# Patient Record
Sex: Female | Born: 1975 | Race: Black or African American | Hispanic: No | Marital: Single | Smoking: Current every day smoker
Health system: Southern US, Community
[De-identification: ages and names within clinical notes are randomized; demographics above are authoritative.]

## PROBLEM LIST (undated history)

## (undated) DIAGNOSIS — A159 Respiratory tuberculosis unspecified: Secondary | ICD-10-CM

## (undated) DIAGNOSIS — D219 Benign neoplasm of connective and other soft tissue, unspecified: Secondary | ICD-10-CM

## (undated) HISTORY — PX: NO PAST SURGERIES: SHX2092

## (undated) HISTORY — DX: Respiratory tuberculosis unspecified: A15.9

## (undated) HISTORY — DX: Benign neoplasm of connective and other soft tissue, unspecified: D21.9

---

## 2017-06-23 ENCOUNTER — Other Ambulatory Visit: Payer: Self-pay

## 2017-06-23 ENCOUNTER — Encounter (HOSPITAL_COMMUNITY): Payer: Self-pay | Admitting: Emergency Medicine

## 2017-06-23 DIAGNOSIS — H00034 Abscess of left upper eyelid: Secondary | ICD-10-CM | POA: Insufficient documentation

## 2017-06-23 DIAGNOSIS — F17200 Nicotine dependence, unspecified, uncomplicated: Secondary | ICD-10-CM | POA: Insufficient documentation

## 2017-06-23 NOTE — ED Triage Notes (Signed)
Patient has an abscess on left eyelid. Patient states it started a little over a week ago as a pimple and has continue to grow.

## 2017-06-24 ENCOUNTER — Encounter (HOSPITAL_COMMUNITY): Payer: Self-pay

## 2017-06-24 ENCOUNTER — Emergency Department (HOSPITAL_COMMUNITY)
Admission: EM | Admit: 2017-06-24 | Discharge: 2017-06-24 | Disposition: A | Discharge status: Home / Self Care | Payer: Self-pay | Attending: Emergency Medicine | Admitting: Emergency Medicine

## 2017-06-24 ENCOUNTER — Emergency Department (HOSPITAL_COMMUNITY): Payer: Self-pay

## 2017-06-24 DIAGNOSIS — L0291 Cutaneous abscess, unspecified: Secondary | ICD-10-CM

## 2017-06-24 LAB — I-STAT BETA HCG BLOOD, ED (MC, WL, AP ONLY)

## 2017-06-24 LAB — CBC
HEMATOCRIT: 35.8 % — AB (ref 36.0–46.0)
Hemoglobin: 11.6 g/dL — ABNORMAL LOW (ref 12.0–15.0)
MCH: 30.5 pg (ref 26.0–34.0)
MCHC: 32.4 g/dL (ref 30.0–36.0)
MCV: 94.2 fL (ref 78.0–100.0)
PLATELETS: 378 10*3/uL (ref 150–400)
RBC: 3.8 MIL/uL — AB (ref 3.87–5.11)
RDW: 14.7 % (ref 11.5–15.5)
WBC: 7.5 10*3/uL (ref 4.0–10.5)

## 2017-06-24 LAB — I-STAT CHEM 8, ED
BUN: 17 mg/dL (ref 6–20)
CHLORIDE: 108 mmol/L (ref 101–111)
Calcium, Ion: 1.01 mmol/L — ABNORMAL LOW (ref 1.15–1.40)
Creatinine, Ser: 0.7 mg/dL (ref 0.44–1.00)
Glucose, Bld: 92 mg/dL (ref 65–99)
HEMATOCRIT: 37 % (ref 36.0–46.0)
HEMOGLOBIN: 12.6 g/dL (ref 12.0–15.0)
POTASSIUM: 4.2 mmol/L (ref 3.5–5.1)
Sodium: 136 mmol/L (ref 135–145)
TCO2: 21 mmol/L — ABNORMAL LOW (ref 22–32)

## 2017-06-24 MED ORDER — IOHEXOL 300 MG/ML  SOLN
75.0000 mL | Freq: Once | INTRAMUSCULAR | Status: AC | PRN
  Administered 2017-06-24: 75 mL via INTRAVENOUS

## 2017-06-24 MED ORDER — LIDOCAINE-EPINEPHRINE (PF) 2 %-1:200000 IJ SOLN
10.0000 mL | Freq: Once | INTRAMUSCULAR | Status: AC
  Administered 2017-06-24: 10 mL via INTRADERMAL
  Filled 2017-06-24: qty 20

## 2017-06-24 MED ORDER — CLINDAMYCIN HCL 300 MG PO CAPS: 300.0000 mg | ORAL_CAPSULE | Freq: Three times a day (TID) | ORAL | 0 refills | Status: AC

## 2017-06-24 MED ORDER — OXYCODONE-ACETAMINOPHEN 5-325 MG PO TABS
1.0000 | ORAL_TABLET | Freq: Once | ORAL | Status: AC
  Administered 2017-06-24: 1 via ORAL
  Filled 2017-06-24: qty 1

## 2017-06-24 NOTE — Discharge Instructions (Addendum)
Please take antibiotic 3 times a day for the next week.  It is normal to have some drainage from the incision for the next few days.  Please wash with warm soapy water daily.  Your blood pressure was elevated in the ER today, please have this rechecked.  Return to the emergency department if you have any new or concerning symptoms like fever greater than 100.4 F, worsening redness or swelling around the eye, difficulty with your vision.

## 2017-06-24 NOTE — ED Provider Notes (Signed)
[facility] DEPT Provider Note   CSN: [id number] Arrival date & time: 06/23/17  2137     History   Chief Complaint Chief Complaint  Patient presents with  . Abscess    HPI Kenedie Dirocco is a 42 y.o. female.  HPI   Ms. Yamamoto is a 42yo female with a history of recurrent boils who presents to the emergency department for evaluation of left eyelid abscess.  Patient reports that about a week ago she noticed what looked like a pimple on the upper left lid.  States that throughout the week it has grown in size, become red and tender to the touch.  She noticed some mild purulent drainage few days ago.  She has tried using home remedies including bacon grease over the eyelid without improvement.  She reports her pain is 8/10 in severity at this time it feels throbbing.  Pain is worsened with palpation of the eyelid.  It is somewhat painful with looking straight upward.  She denies fevers, chills, visual disturbance, headache, abdominal pain, nausea/vomiting.  She denies history of IV drug use.  History reviewed. No pertinent past medical history.  There are no active problems to display for this patient.   History reviewed. No pertinent surgical history.   OB History   None      Home Medications    Prior to Admission medications   Not on File    Family History History reviewed. No pertinent family history.  Social History Social History   Tobacco Use  . Smoking status: Current Every Day Smoker  . Smokeless tobacco: Never Used  Substance Use Topics  . Alcohol use: Never    Frequency: Never  . Drug use: Never     Allergies   Patient has no known allergies.   Review of Systems Review of Systems  Constitutional: Negative for chills and fever.  Eyes: Negative for photophobia, pain, redness and visual disturbance.  Gastrointestinal: Negative for abdominal pain, nausea and vomiting.  Skin: Positive for color change (red bump  on left upper lid).  Neurological: Negative for headaches.     Physical Exam Updated Vital Signs BP (!) 154/88 (BP Location: Left Arm)   Pulse 80   Temp 97.8 F (36.6 C) (Oral)   Resp 14   Ht 5' 2.5" (1.588 m)   Wt 64.9 kg (143 lb)   LMP 06/23/2017 Comment: negative beta HCG 06/24/17  SpO2 100%   BMI 25.74 kg/m   Physical Exam  Constitutional: She appears well-developed and well-nourished. No distress.  HENT:  Head: Normocephalic and atraumatic.  Mouth/Throat: Oropharynx is clear and moist. No oropharyngeal exudate.  Eyes: Right eye exhibits no discharge. Left eye exhibits no discharge.  1cm area of fluctuance below the left eyebrow with erythema and tenderness. No active drainage.  No proptosis.  Full EOM, although painful with moving up and to the left. PERRL. Peripheral visual fields intact to confrontation. No scleral erythema.   Neck: Normal range of motion. Neck supple.  Pulmonary/Chest: Effort normal. No respiratory distress.  Neurological: She is alert. Coordination normal.  Skin: She is not diaphoretic.  Psychiatric: She has a normal mood and affect. Her behavior is normal.  Nursing note and vitals reviewed.      ED Treatments / Results  Labs (all labs ordered are listed, but only abnormal results are displayed) Labs Reviewed  CBC - Abnormal; Notable for the following components:      Result Value   RBC 3.80 (*)  Hemoglobin 11.6 (*)    HCT 35.8 (*)    All other components within normal limits  I-STAT CHEM 8, ED - Abnormal; Notable for the following components:   Calcium, Ion 1.01 (*)    TCO2 21 (*)    All other components within normal limits  I-STAT BETA HCG BLOOD, ED ([facility], [facility], [facility] ONLY)    EKG None  Radiology Ct Orbits W Contrast  Result Date: 06/24/2017 CLINICAL DATA:  42 y/o  F; large knot on her left eyelid. EXAM: CT ORBITS WITH CONTRAST TECHNIQUE: Multidetector CT images was performed according to the standard protocol following intravenous  contrast administration. CONTRAST:  66mL OMNIPAQUE IOHEXOL 300 MG/ML  SOLN COMPARISON:  None. FINDINGS: Orbits: No traumatic or inflammatory finding. Globes, optic nerves, orbital fat, extraocular muscles, vascular structures, and lacrimal glands are normal. Visualized sinuses: Mild mucosal thickening with the left sphenoid sinus and the anterior ethmoid sinuses. Soft tissues: Left superolateral periorbital dermal rim enhancing collection measuring 10 x 7 x 10 mm (AP x ML x CC series 4, image 10 and series 8, image 57). There is surrounding inflammation in the periorbital soft tissues. Limited intracranial: No significant or unexpected finding. IMPRESSION: 10 mm abscess of the left superolateral periorbital dermis. No orbital compartment inflammation. Electronically Signed   By: Kristine Garbe M.D.   On: 06/24/2017 06:46    Procedures .Marland KitchenIncision and Drainage Date/Time: 06/24/2017 7:29 AM Performed by: Glyn Ade, PA-C Authorized by: Glyn Ade, PA-C   Consent:    Consent obtained:  Verbal and emergent situation   Consent given by:  Patient   Risks discussed:  Bleeding, incomplete drainage, infection and pain   Alternatives discussed:  No treatment and delayed treatment Location:    Type:  Abscess   Size:  1cm   Location:  Head   Head location:  Face Pre-procedure details:    Skin preparation:  Betadine Anesthesia (see MAR for exact dosages):    Anesthesia method:  Local infiltration   Local anesthetic:  Lidocaine 2% WITH epi Procedure type:    Complexity:  Simple Procedure details:    Incision types:  Single straight   Scalpel blade:  11   Wound management:  Probed and deloculated   Drainage:  Purulent   Drainage amount:  Moderate   Wound treatment:  Wound left open   Packing materials:  None Post-procedure details:    Patient tolerance of procedure:  Tolerated well, no immediate complications   (including critical care time)  Medications Ordered in  ED Medications  lidocaine-EPINEPHrine (XYLOCAINE W/EPI) 2 %-1:200000 (PF) injection 10 mL (10 mLs Intradermal Given by Other 06/24/17 0510)  oxyCODONE-acetaminophen (PERCOCET/ROXICET) 5-325 MG per tablet 1 tablet (1 tablet Oral Given 06/24/17 0510)  iohexol (OMNIPAQUE) 300 MG/ML solution 75 mL (75 mLs Intravenous Contrast Given 06/24/17 0624)     Initial Impression / Assessment and Plan / ED Course  I have reviewed the triage vital signs and the nursing notes.  Pertinent labs & imaging results that were available during my care of the patient were reviewed by me and considered in my medical decision making (see chart for details).     Patient presents with abscess below the left eyebrow.  She denies fever, but does report pain with looking up into the left.  No proptosis on exam.  Given painful EOM, concern for orbital cellulitis.  CT orbit reveals a 1 cm abscess over the superior lateral periorbital dermis, no orbital cellulitis.  Otherwise lab work reassuring,  no leukocytosis.  No major electrolyte abnormalities.  I&D performed in the emergency department.  Patient tolerated procedure well.  Will discharge with clindamycin and have counseled her on return precautions.  Her blood pressure was elevated in the ER today, counseled her to have this rechecked by her PCP.  Patient agrees and voiced understanding to the above plan and appears reliable for follow-up.  Discussed this patient with Dr. Kathrynn Humble who agrees with above plan and discharge home.  Final Clinical Impressions(s) / ED Diagnoses   Final diagnoses:  Abscess    ED Discharge Orders        Ordered    clindamycin (CLEOCIN) 300 MG capsule  3 times daily     06/24/17 0725       Glyn Ade, PA-C 06/24/17 Houck, Ankit, MD 06/24/17 [phone]

## 2017-06-24 NOTE — ED Notes (Signed)
Bed: WA06 Expected date:  Expected time:  Means of arrival:  Comments: 

## 2017-06-24 NOTE — ED Notes (Signed)
I and D tray with lidocaine is at bedside.

## 2017-06-24 NOTE — ED Notes (Signed)
Bed: [facility]ALA Expected date:  Expected time:  Means of arrival:  Comments: 

## 2017-09-14 ENCOUNTER — Other Ambulatory Visit: Payer: Self-pay

## 2017-09-14 ENCOUNTER — Emergency Department (HOSPITAL_COMMUNITY)
Admission: EM | Admit: 2017-09-14 | Discharge: 2017-09-14 | Disposition: A | Discharge status: Home / Self Care | Payer: Self-pay | Attending: Emergency Medicine | Admitting: Emergency Medicine

## 2017-09-14 ENCOUNTER — Emergency Department (HOSPITAL_COMMUNITY): Payer: Self-pay

## 2017-09-14 ENCOUNTER — Encounter (HOSPITAL_COMMUNITY): Payer: Self-pay | Admitting: *Deleted

## 2017-09-14 DIAGNOSIS — R531 Weakness: Secondary | ICD-10-CM | POA: Insufficient documentation

## 2017-09-14 DIAGNOSIS — M542 Cervicalgia: Secondary | ICD-10-CM | POA: Insufficient documentation

## 2017-09-14 DIAGNOSIS — H00034 Abscess of left upper eyelid: Secondary | ICD-10-CM | POA: Insufficient documentation

## 2017-09-14 DIAGNOSIS — R202 Paresthesia of skin: Secondary | ICD-10-CM | POA: Insufficient documentation

## 2017-09-14 DIAGNOSIS — L0201 Cutaneous abscess of face: Secondary | ICD-10-CM

## 2017-09-14 DIAGNOSIS — F17200 Nicotine dependence, unspecified, uncomplicated: Secondary | ICD-10-CM | POA: Insufficient documentation

## 2017-09-14 LAB — BASIC METABOLIC PANEL
ANION GAP: 6 (ref 5–15)
BUN: 12 mg/dL (ref 6–20)
CO2: 25 mmol/L (ref 22–32)
Calcium: 8.7 mg/dL — ABNORMAL LOW (ref 8.9–10.3)
Chloride: 104 mmol/L (ref 98–111)
Creatinine, Ser: 0.77 mg/dL (ref 0.44–1.00)
GFR calc Af Amer: >60 mL/min (ref >60)
GFR calc non Af Amer: >60 mL/min (ref >60)
GLUCOSE: 80 mg/dL (ref 70–99)
Potassium: 3.9 mmol/L (ref 3.5–5.1)
Sodium: 135 mmol/L (ref 135–145)

## 2017-09-14 LAB — CBC WITH DIFFERENTIAL/PLATELET
Basophils Absolute: 0.1 10*3/uL (ref 0.0–0.1)
Basophils Relative: 1 %
Eosinophils Absolute: 0.4 10*3/uL (ref 0.0–0.7)
Eosinophils Relative: 6 %
HEMATOCRIT: 35.9 % — AB (ref 36.0–46.0)
Hemoglobin: 11.9 g/dL — ABNORMAL LOW (ref 12.0–15.0)
LYMPHS ABS: 2.7 10*3/uL (ref 0.7–4.0)
LYMPHS PCT: 43 %
MCH: 31.3 pg (ref 26.0–34.0)
MCHC: 33.1 g/dL (ref 30.0–36.0)
MCV: 94.5 fL (ref 78.0–100.0)
MONOS PCT: 9 %
Monocytes Absolute: 0.5 10*3/uL (ref 0.1–1.0)
NEUTROS ABS: 2.6 10*3/uL (ref 1.7–7.7)
Neutrophils Relative %: 41 %
Platelets: 369 10*3/uL (ref 150–400)
RBC: 3.8 MIL/uL — ABNORMAL LOW (ref 3.87–5.11)
RDW: 15.8 % — ABNORMAL HIGH (ref 11.5–15.5)
WBC: 6.2 10*3/uL (ref 4.0–10.5)

## 2017-09-14 LAB — POC URINE PREG, ED: Preg Test, Ur: NEGATIVE

## 2017-09-14 MED ORDER — DIAZEPAM 5 MG PO TABS
5.0000 mg | ORAL_TABLET | Freq: Once | ORAL | Status: AC
  Administered 2017-09-14: 5 mg via ORAL
  Filled 2017-09-14: qty 1

## 2017-09-14 MED ORDER — HYDROCODONE-ACETAMINOPHEN 5-325 MG PO TABS
1.0000 | ORAL_TABLET | Freq: Once | ORAL | Status: AC
  Administered 2017-09-14: 1 via ORAL
  Filled 2017-09-14: qty 1

## 2017-09-14 MED ORDER — METHOCARBAMOL 500 MG PO TABS: 500.0000 mg | ORAL_TABLET | Freq: Two times a day (BID) | ORAL | 0 refills | Status: AC

## 2017-09-14 MED ORDER — DOXYCYCLINE HYCLATE 100 MG PO CAPS: 100.0000 mg | ORAL_CAPSULE | Freq: Two times a day (BID) | ORAL | 0 refills | Status: AC

## 2017-09-14 MED ORDER — LIDOCAINE 5 % EX PTCH
1.0000 | MEDICATED_PATCH | CUTANEOUS | Status: DC
  Administered 2017-09-14: 1 via TRANSDERMAL
  Filled 2017-09-14: qty 1

## 2017-09-14 MED ORDER — DOXYCYCLINE HYCLATE 100 MG PO TABS
100.0000 mg | ORAL_TABLET | Freq: Once | ORAL | Status: AC
Start: 2017-09-14 — End: 2017-09-14
  Administered 2017-09-14: 100 mg via ORAL
  Filled 2017-09-14: qty 1

## 2017-09-14 MED ORDER — GADOBENATE DIMEGLUMINE 529 MG/ML IV SOLN
15.0000 mL | Freq: Once | INTRAVENOUS | Status: AC | PRN
  Administered 2017-09-14: 14 mL via INTRAVENOUS

## 2017-09-14 MED ORDER — HYDROCODONE-ACETAMINOPHEN 5-325 MG PO TABS: 1.0000 | ORAL_TABLET | Freq: Four times a day (QID) | ORAL | 0 refills | Status: DC | PRN

## 2017-09-14 MED ORDER — LIDOCAINE HCL (PF) 1 % IJ SOLN
10.0000 mL | Freq: Once | INTRAMUSCULAR | Status: AC
  Administered 2017-09-14: 10 mL
  Filled 2017-09-14: qty 30

## 2017-09-14 NOTE — ED Provider Notes (Signed)
[facility] DEPT Provider Note   CSN: [id number] Arrival date & time: 09/14/17  1400     History   Chief Complaint Chief Complaint  Patient presents with  . Abscess    HPI Lori Hughes is a 41 y.o. female.  HPI   Patient is a 73 old female with no significant past medical history presenting for abscess of the left upper eyelid.  Patient reports that she has had this before, and it was drained approximately 1 month ago, however it is returned over the past week.  Patient denies any drainage from it, obstructive vision, swelling or redness around the eye, retro-orbital pain, or fever chills.  No history of IVDU.  Patient reports he does have history of recurrent abscesses.  Patient is also noting that she has had some left-sided neck pain progressing into arm weakness over the past 6 days.  Patient reports that happened to her one month ago, however it resolved spontaneously.  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  Patient denies any numbness of any of her extremities, nor bilateral lower extreme any weakness.  Patient denies any midline neck pain, recent neck trauma, neck surgeries, or history of autoimmune disease.  Patient denies any saddle anesthesia, loss of our bladder control, visual disturbance, or vertigo.  History reviewed. No pertinent past medical history.  There are no active problems to display for this patient.   History reviewed. No pertinent surgical history.   OB History   None      Home Medications    Prior to Admission medications   Medication Sig Start Date End Date Taking? Authorizing Provider  ibuprofen (ADVIL,MOTRIN) 200 MG tablet Take 1,200 mg by mouth every 6 (six) hours as needed for moderate pain.   Yes [provider]    Family History No family history on file.  Social History Social History   Tobacco Use  . Smoking status: Current Every Day Smoker  . Smokeless tobacco: Never  Used  Substance Use Topics  . Alcohol use: Never    Frequency: Never  . Drug use: Never     Allergies   Patient has no known allergies.   Review of Systems Review of Systems  Constitutional: Negative for chills and fever.  HENT: Negative for congestion, rhinorrhea, sinus pain and sore throat.   Eyes: Negative for visual disturbance.  Respiratory: Negative for cough, chest tightness and shortness of breath.   Cardiovascular: Negative for chest pain, palpitations and leg swelling.  Gastrointestinal: Negative for abdominal pain, nausea and vomiting.  Genitourinary: Negative for dysuria and flank pain.  Musculoskeletal: Negative for back pain and myalgias.  Skin: Positive for color change. Negative for rash.  Neurological: Positive for weakness and numbness. Negative for dizziness and syncope.     Physical Exam Updated Vital Signs BP 131/85 (BP Location: Right Arm)   Pulse 79   Temp 97.7 F (36.5 C) (Oral)   Resp 17   Ht 5\' 2"  (1.575 m)   Wt 65.8 kg (145 lb)   SpO2 100%   BMI 26.52 kg/m   Physical Exam  Constitutional: She appears well-developed and well-nourished. No distress.  HENT:  Head: Normocephalic and atraumatic.  Mouth/Throat: Oropharynx is clear and moist.  Eyes: Pupils are equal, round, and reactive to light. Conjunctivae and EOM are normal.  See clinical photo for details.  Patient has a well-circumscribed, 1.5 cm in diameter fluctuant nodule along the left superior orbital rim.  Neck: Normal range of  motion. Neck supple.  Cardiovascular: Normal rate, regular rhythm, S1 normal and S2 normal.  No murmur heard. Pulmonary/Chest: Effort normal and breath sounds normal. She has no wheezes. She has no rales.  Abdominal: Soft. She exhibits no distension. There is no tenderness. There is no guarding.  Musculoskeletal: Normal range of motion. She exhibits no edema or deformity.  PALPATION: No midline but paraspinal musculature tenderness of cervical and thoracic  spine on left.  ROM of cervical spine intact with flexion/extension/lateral flexion/lateral rotation; Patient can laterally rotate cervical spine greater than 45 degrees.  MOTOR: 4/5 strength on left with resisted shoulder abduction/adduction, biceps flexion (C5/6), biceps extension (C6-C8), wrist flexion, wrist extension (C6-C8), and grip strength (C7-T1). Strength 5/5 in RUE. 2+ DTRs in the biceps and triceps. Symmetric b/l SENSORY: Sensation is intact to light touch in:  Superficial radial nerve distribution (dorsal first web space).Median nerve distribution (tip of index finger)   Ulnar nerve distribution (tip of small finger)   Reported decreased sensation over deltoid/axillary nerve distribution. Patient moves LEs symmetrically and with good coordination. Patient ambulates symmetrically with no evidence of LE weakness.   Lymphadenopathy:    She has no cervical adenopathy.  Neurological: She is alert.  Cranial nerves grossly intact. Patient moves extremities symmetrically and with good coordination.  Skin: Skin is warm and dry. No rash noted. No erythema.  Psychiatric: She has a normal mood and affect. Her behavior is normal. Judgment and thought content normal.  Nursing note and vitals reviewed.      ED Treatments / Results  Labs (all labs ordered are listed, but only abnormal results are displayed) Labs Reviewed  BASIC METABOLIC PANEL - Abnormal; Notable for the following components:      Result Value   Calcium 8.7 (*)    All other components within normal limits  CBC WITH DIFFERENTIAL/PLATELET - Abnormal; Notable for the following components:   RBC 3.80 (*)    Hemoglobin 11.9 (*)    HCT 35.9 (*)    RDW 15.8 (*)    All other components within normal limits  POC URINE PREG, ED    EKG None  Radiology Mr Jeri Cos And Wo Contrast  Result Date: 09/14/2017 CLINICAL DATA:  Left arm pain and weakness beginning 8 days ago. EXAM: MRI HEAD WITHOUT AND WITH CONTRAST MRI  CERVICAL SPINE WITHOUT AND WITH CONTRAST TECHNIQUE: Multiplanar, multiecho pulse sequences of the brain and surrounding structures, and cervical spine, to include the craniocervical junction and cervicothoracic junction, were obtained without and with intravenous contrast. CONTRAST:  32mL MULTIHANCE GADOBENATE DIMEGLUMINE 529 MG/ML IV SOLN COMPARISON:  Head CT 06/24/2017 FINDINGS: MRI HEAD FINDINGS Brain: Brain has normal appearance without evidence of malformation, atrophy, old or acute small or large vessel infarction, mass lesion, hemorrhage, hydrocephalus or extra-axial collection. Cerebellar tonsils extend 2 mm through the foramen magnum, upper limits of normal. No abnormal contrast enhancement occurs. Vascular: Major vessels at the base of the brain show flow. Venous sinuses appear patent. Skull and upper cervical spine: Normal. Sinuses/Orbits: Some mucosal inflammatory changes of the sinuses, particularly in the frontal and ethmoid regions. Orbits appear negative. Other: One can appreciate the superficial soft tissue abscess in the left supraorbital region, with the nonenhancing fluid collection measuring about 11 mm in diameter. No evidence of intracranial extension. No evidence that this relates to the underlying fairly mild inflammatory changes of the paranasal sinuses. MRI CERVICAL SPINE FINDINGS Alignment: Kyphotic curvature in the cervical region. Vertebrae: No fracture or primary bone lesion. Cord:  No cord compression or primary cord lesion. Posterior Fossa, vertebral arteries, paraspinal tissues: Negative. As noted above, cerebellar tonsils extend 2 or 3 mm through the foramen magnum, upper limits of normal. Disc levels: No abnormality at C1-2, C2-3 or C3-4. C4-5: Shallow protrusion of the disc associated with endplate osteophytes. Narrowing of the ventral subarachnoid space but no compression of the cord. Foraminal encroachment bilaterally that could possibly affect either C5 nerve. C5-6: Small  central disc protrusion indents the ventral subarachnoid space but does not compress the cord or show foraminal extension. C6-7 and C7-T1: Normal. IMPRESSION: Brain MRI: Normal appearance of the brain itself. Mild mucosal inflammation of the paranasal sinuses. Left supraorbital superficial soft tissue abscess, 11 mm in diameter. Cervical spine MRI: Kyphotic curvature. 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 Canal narrowing without cord compression. Bilateral foraminal encroachment that could affect either or both C5 nerves. I would suspect these findings are chronic. Electronically Signed   By: Nelson Chimes M.D.   On: 09/14/2017 20:20   Mr Cervical Spine W Wo Contrast  Result Date: 09/14/2017 CLINICAL DATA:  Left arm pain and weakness beginning 8 days ago. EXAM: MRI HEAD WITHOUT AND WITH CONTRAST MRI CERVICAL SPINE WITHOUT AND WITH CONTRAST TECHNIQUE: Multiplanar, multiecho pulse sequences of the brain and surrounding structures, and cervical spine, to include the craniocervical junction and cervicothoracic junction, were obtained without and with intravenous contrast. CONTRAST:  32mL MULTIHANCE GADOBENATE DIMEGLUMINE 529 MG/ML IV SOLN COMPARISON:  Head CT 06/24/2017 FINDINGS: MRI HEAD FINDINGS Brain: Brain has normal appearance without evidence of malformation, atrophy, old or acute small or large vessel infarction, mass lesion, hemorrhage, hydrocephalus or extra-axial collection. Cerebellar tonsils extend 2 mm through the foramen magnum, upper limits of normal. No abnormal contrast enhancement occurs. Vascular: Major vessels at the base of the brain show flow. Venous sinuses appear patent. Skull and upper cervical spine: Normal. Sinuses/Orbits: Some mucosal inflammatory changes of the sinuses, particularly in the frontal and ethmoid regions. Orbits appear negative. Other: One can appreciate the superficial soft tissue abscess in the left supraorbital region, with the nonenhancing fluid collection measuring about 11  mm in diameter. No evidence of intracranial extension. No evidence that this relates to the underlying fairly mild inflammatory changes of the paranasal sinuses. MRI CERVICAL SPINE FINDINGS Alignment: Kyphotic curvature in the cervical region. Vertebrae: No fracture or primary bone lesion. Cord: No cord compression or primary cord lesion. Posterior Fossa, vertebral arteries, paraspinal tissues: Negative. As noted above, cerebellar tonsils extend 2 or 3 mm through the foramen magnum, upper limits of normal. Disc levels: No abnormality at C1-2, C2-3 or C3-4. C4-5: Shallow protrusion of the disc associated with endplate osteophytes. Narrowing of the ventral subarachnoid space but no compression of the cord. Foraminal encroachment bilaterally that could possibly affect either C5 nerve. C5-6: Small central disc protrusion indents the ventral subarachnoid space but does not compress the cord or show foraminal extension. C6-7 and C7-T1: Normal. IMPRESSION: Brain MRI: Normal appearance of the brain itself. Mild mucosal inflammation of the paranasal sinuses. Left supraorbital superficial soft tissue abscess, 11 mm in diameter. Cervical spine MRI: Kyphotic curvature. 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 Canal narrowing without cord compression. Bilateral foraminal encroachment that could affect either or both C5 nerves. I would suspect these findings are chronic. Electronically Signed   By: Nelson Chimes M.D.   On: 09/14/2017 20:20    Procedures .Marland KitchenIncision and Drainage Date/Time: 09/15/2017 12:05 AM Performed by: Albesa Seen, PA-C Authorized by: Albesa Seen, PA-C   Consent:  Consent obtained:  Verbal   Consent given by:  Patient   Risks discussed:  Bleeding, incomplete drainage and pain Location:    Type:  Abscess   Location:  Head   Head location:  L eyelid (Left superior orbital rim) Pre-procedure details:    Skin preparation:  Betadine Anesthesia (see MAR for exact dosages):    Anesthesia method:   Local infiltration   Local anesthetic:  Lidocaine 1% w/o epi Procedure type:    Complexity:  Simple Procedure details:    Needle aspiration: no     Incision types:  Stab incision   Incision depth:  Dermal   Scalpel blade:  11   Wound management:  Probed and deloculated   Drainage:  Purulent   Drainage amount:  Moderate   Wound treatment:  Wound left open   Packing materials:  None Post-procedure details:    Patient tolerance of procedure:  Tolerated well, no immediate complications   (including critical care time)  Medications Ordered in ED Medications - No data to display   Initial Impression / Assessment and Plan / ED Course  I have reviewed the triage vital signs and the nursing notes.  Pertinent labs & imaging results that were available during my care of the patient were reviewed by me and considered in my medical decision making (see chart for details).  Clinical Course as of Aug 05 0001  Nancy Fetter Sep 14, 2017  2230 Stable per chart review.  Hemoglobin(!): 11.9 [AM]  Mon Sep 15, 2017  0000 Normal except for hypocalcemia (slight).   Basic metabolic panel(!) [AM]    Clinical Course User Index [AM] Albesa Seen, PA-C    Patient is nontoxic-appearing, afebrile, and in no acute distress.  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  Consistent with prior, however it appears less severe the patient presentation 1 months ago.  CT orbit at that time demonstrated no evidence of periorbital cellulitis, and clinically patient does not have any evidence of orbital or periorbital cellulitis with no proptosis, retro-orbital pain, or extraocular muscle entrapment.  Will I&D at bedside.  Suspicious for sebaceous cyst that will need ultimate removal by ophthalmology or dermatology.  Regarding patient's left arm weakness and decreased sensation over the deltoid region, differential diagnosis includes discitis versus new diagnosis of multiple sclerosis, trapezius  spasm, cervical radiculopathy.  Will obtain MRI of the brain and the cervical spine with and without contrast after I&D.  Patient updated and is in understanding and agreement with the plan of care.  Hemoglobin  Date Value Ref Range Status  09/14/2017 11.9 (L) 12.0 - 15.0 g/dL Final  06/24/2017 12.6 12.0 - 15.0 g/dL Final  06/24/2017 11.6 (L) 12.0 - 15.0 g/dL Final   MRI brain and cervical spine with and without contrast without any evidence of transverse myelitis, demyelinating lesions, or other acute abnormalities.  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  I&D performed at bedside successfully.  Purulent drainage expressed.  Patient was given instructions on how to properly care for incision and drainage site including warm compresses.  Patient given return precautions for any redevelopment of abscess, worsening pain, or spreading redness.  Additionally, with patient's neck symptoms, feel it is at likely secondary to trapezius spasm, and instructed heat therapy as well as muscle relaxation.  Discussed with the patient that I feel that her recurrent abscesses are possibly secondary to infected sebaceous cyst, and definitive therapy achieved by removing the cyst capsule, performed by dermatology  ophthalmology.  Ophthalmology follow-up provided.  This is a supervised visit with Dr. Nanda Quinton. Evaluation, management, and discharge planning discussed with this attending physician.  Final Clinical Impressions(s) / ED Diagnoses   Final diagnoses:  Abscess, eyebrow  Neck pain on left side  Paresthesias    ED Discharge Orders        Ordered    doxycycline (VIBRAMYCIN) 100 MG capsule  2 times daily     09/14/17 2345    HYDROcodone-acetaminophen (NORCO/VICODIN) 5-325 MG tablet  Every 6 hours PRN     09/14/17 2345    methocarbamol (ROBAXIN) 500 MG tablet  2 times daily     09/14/17 2348       Albesa Seen, PA-C 09/15/17 0006    Margette Fast, MD 09/15/17 1145

## 2017-09-14 NOTE — ED Notes (Signed)
Will collect urine when pt. Voids. Nurses aware.

## 2017-09-14 NOTE — Discharge Instructions (Addendum)
Please see the information and instructions below regarding your visit.  Your diagnoses today include:  1. Abscess, eyebrow   2. Neck pain on left side   3. Paresthesias     Abscesses form when an infection in your skin starts to collect bacteria and white blood cells, walling it off from the rest of your body to protect you from a bigger infection. Risk factors for this type of infection include:  ?Break in the skin ?Diabetes ?Swollen areas  Sometimes the infection starts to spread to surrounding tissue, causing redness and swelling. We call this cellulitis.   Tests performed today include: See side panel of your discharge paperwork for testing performed today. Vital signs are listed at the bottom of these instructions.   Medications prescribed:    Take any prescribed medications only as prescribed, and any over the counter medications only as directed on the packaging.  1. Doxycycline is an antibiotic that fights infection in the the skin. This medication can make your skin sensitive to the sun, so please ensure that you wear sunscreen, hats, or other coverage over your skin while taking this. This medicine CANNOT be taken by women while pregnant, breastfeeding, or trying to become pregnant.  Please speak with a healthcare provider if any of these situations apply to you.  2. You have been prescribed Norco for pain. This is an opioid pain medication. You may take this medication every 4-6 hours as needed for pain. Only take this medication if you need it for breakthrough pain. You may combine this medicine with ibuprofen, a non-steroidal anti-inflammatory drug (NSAID) every 6 hours, so you are getting something for pain relief every 3 hours.  Do not combine this medication with Tylenol, as it may increase the risk of liver problems.  Do not combine this medication with alcohol.  Please be advised to avoid driving or operating heavy machinery while taking this medication, as it may  make you drowsy or impair judgment.    Home care instructions:  Please follow any educational materials contained in this packet.   Some things that may promote healing of your wound and infection include:  Keep the infected area clean and dry. You can take a shower or bath, but be sure to pat the area dry with a towel afterward. Do not put any antibiotic ointments or creams on the area. Reapply a dry gauze dressing any time the bandage has become soaked with drainage, or after cleansing the wound.  Apply warm compresses to the wound 3-4 times daily to encourage drainage.   Return instructions:  Please return to the Emergency Department if you experience worsening symptoms. You should return for reevaluation of your infection if you notice spreading redness, increased swelling, an abscess develops, or you develop signs and symptoms of a systemic illness such as fever and chills.  During the emergency department if any fever chills, worsening neck pain, worsening weakness or sensation of numbness in your arm. Please return if you have any other emergent concerns.  Additional Information:   Your vital signs today were: BP (!) 138/95    Pulse 69    Temp 97.7 F (36.5 C) (Oral)    Resp 16    Ht 5\' 2"  (1.575 m)    Wt 65.8 kg (145 lb)    SpO2 100%    BMI 26.52 kg/m  If your blood pressure (BP) was elevated on multiple readings during this visit above 130 for the top number or above 80 for  the bottom number, please have this repeated by your primary care provider within one month. --------------  Thank you for allowing [place] to participate in your care today.

## 2017-09-14 NOTE — ED Triage Notes (Signed)
Pt here for abscess over left eye, also wants to be evaluated for  left arm, can not raise it.

## 2017-09-15 MED ORDER — FLUTICASONE PROPIONATE 50 MCG/ACT NA SUSP: 1.0000 | Freq: Every day | NASAL | 2 refills | Status: DC

## 2017-09-15 MED ORDER — CETIRIZINE HCL 10 MG PO TABS: 10.0000 mg | ORAL_TABLET | Freq: Every day | ORAL | 0 refills | Status: DC

## 2018-10-09 ENCOUNTER — Emergency Department (HOSPITAL_COMMUNITY)
Admission: EM | Admit: 2018-10-09 | Discharge: 2018-10-09 | Disposition: A | Discharge status: Home / Self Care | Payer: Self-pay | Attending: Emergency Medicine | Admitting: Emergency Medicine

## 2018-10-09 ENCOUNTER — Encounter (HOSPITAL_COMMUNITY): Payer: Self-pay

## 2018-10-09 ENCOUNTER — Other Ambulatory Visit: Payer: Self-pay

## 2018-10-09 DIAGNOSIS — F17200 Nicotine dependence, unspecified, uncomplicated: Secondary | ICD-10-CM | POA: Insufficient documentation

## 2018-10-09 DIAGNOSIS — D2330 Other benign neoplasm of skin of unspecified part of face: Secondary | ICD-10-CM

## 2018-10-09 DIAGNOSIS — Z79899 Other long term (current) drug therapy: Secondary | ICD-10-CM | POA: Insufficient documentation

## 2018-10-09 DIAGNOSIS — L720 Epidermal cyst: Secondary | ICD-10-CM | POA: Insufficient documentation

## 2018-10-09 MED ORDER — IBUPROFEN 800 MG PO TABS: 800.0000 mg | ORAL_TABLET | Freq: Three times a day (TID) | ORAL | 0 refills | Status: DC | PRN

## 2018-10-09 NOTE — ED Triage Notes (Signed)
Pt reports a cyst on her L temple x 1 year and her R temple x 1 month. States that they cause sharp pain and need to be removed. A&Ox4.

## 2018-10-09 NOTE — ED Provider Notes (Signed)
[facility] DEPT Provider Note   CSN: [id number] Arrival date & time: 10/09/18  0520     History   Chief Complaint Chief Complaint  Patient presents with  . Cyst    HPI Lori Hughes is a 43 y.o. female.     HPI Patient presents to the emergency department with cyst to the bilateral temporal region.  The patient states that these have been over there for the last year.  The patient states they are starting to be painful as well.  Patient denies any fevers, nausea, vomiting, weakness, dizziness, headache, blurred vision, or syncope.  Patient states she is tried to express material out of them without success. History reviewed. No pertinent past medical history.  There are no active problems to display for this patient.   History reviewed. No pertinent surgical history.   OB History   No obstetric history on file.      Home Medications    Prior to Admission medications   Medication Sig Start Date End Date Taking? Authorizing Provider  cetirizine (ZYRTEC) 10 MG tablet Take 1 tablet (10 mg total) by mouth daily. 09/15/17 10/15/17  Langston Masker B, PA-C  fluticasone (FLONASE) 50 MCG/ACT nasal spray Place 1 spray into both nostrils daily. 09/15/17   Langston Masker B, PA-C  HYDROcodone-acetaminophen (NORCO/VICODIN) 5-325 MG tablet Take 1-2 tablets by mouth every 6 (six) hours as needed. 09/14/17   Langston Masker B, PA-C  ibuprofen (ADVIL,MOTRIN) 200 MG tablet Take 1,200 mg by mouth every 6 (six) hours as needed for moderate pain.    [provider]    Family History History reviewed. No pertinent family history.  Social History Social History   Tobacco Use  . Smoking status: Current Every Day Smoker  . Smokeless tobacco: Never Used  Substance Use Topics  . Alcohol use: Never    Frequency: Never  . Drug use: Never     Allergies   Patient has no known allergies.   Review of Systems Review of Systems All other systems  negative except as documented in the HPI. All pertinent positives and negatives as reviewed in the HPI.  Physical Exam Updated Vital Signs BP (!) 146/85   Pulse 71   Temp 98.5 F (36.9 C) (Oral)   Resp 16   Ht 5\' 3"  (1.6 m)   Wt 65.8 kg   SpO2 100%   BMI 25.69 kg/m   Physical Exam Vitals signs and nursing note reviewed.  Constitutional:      General: She is not in acute distress.    Appearance: She is well-developed.  HENT:     Head: Normocephalic and atraumatic.  Eyes:     Pupils: Pupils are equal, round, and reactive to light.  Pulmonary:     Effort: Pulmonary effort is normal.  Skin:    General: Skin is warm and dry.       Neurological:     Mental Status: She is alert and oriented to person, place, and time.      ED Treatments / Results  Labs (all labs ordered are listed, but only abnormal results are displayed) Labs Reviewed - No data to display  EKG None  Radiology No results found.  Procedures Procedures (including critical care time)  Medications Ordered in ED Medications - No data to display   Initial Impression / Assessment and Plan / ED Course  I have reviewed the triage vital signs and the nursing notes.  Pertinent labs & imaging results that  were available during my care of the patient were reviewed by me and considered in my medical decision making (see chart for details).       I spoke with Dr. Eusebio Friendly office about an appointment for the patient and that is scheduled for 9/11 of this year.  The patient agrees this plan and all questions were answered.  I did explain to her that proper removal and cosmetics of this are important since they are on her face.  Patient agreed to the plan and all questions were answered. Final Clinical Impressions(s) / ED Diagnoses   Final diagnoses:  None    ED Discharge Orders    None       Dalia Heading, PA-C 10/09/18 [id number]    Maudie Flakes, MD 10/15/18 [phone]

## 2018-10-09 NOTE — Discharge Instructions (Signed)
You have an appointment scheduled with Dr.Dillingham scheduled for 10/23/2018 at 915.  I would arrive 15 minutes early.

## 2018-10-22 ENCOUNTER — Telehealth: Payer: Self-pay

## 2018-10-22 NOTE — Telephone Encounter (Signed)

## 2018-10-23 ENCOUNTER — Ambulatory Visit (INDEPENDENT_AMBULATORY_CARE_PROVIDER_SITE_OTHER): Payer: Self-pay | Admitting: Plastic Surgery

## 2018-10-23 ENCOUNTER — Encounter: Payer: Self-pay | Admitting: Plastic Surgery

## 2018-10-23 ENCOUNTER — Other Ambulatory Visit: Payer: Self-pay

## 2018-10-23 DIAGNOSIS — L723 Sebaceous cyst: Secondary | ICD-10-CM | POA: Insufficient documentation

## 2018-10-23 MED ORDER — DOXYCYCLINE HYCLATE 50 MG PO TABS: 1.0000 | ORAL_TABLET | Freq: Two times a day (BID) | ORAL | 1 refills | Status: DC

## 2018-10-23 NOTE — Progress Notes (Signed)
     Patient ID: Lori Hughes, female    DOB: 07-30-1975, 43 y.o.   MRN: [id number]   No chief complaint on file.   The patient is a 43 year old female here for evaluation of her skin.  She has suffered from severe acne for many years.  She now has several cyst areas.  She states that they are tender and getting larger.  There is one on the right temple area one on the left temple area and one in the left superior orbital area.  None of them look infected.  She has widespread acne scarring on her face.  She has tried multiple things in the past.  It helps for short period of time and then it flares up again.  She is otherwise in good health.  The areas are 1 cm in size.   Review of Systems  Constitutional: Negative for activity change and appetite change.  HENT: Negative for congestion.   Eyes: Negative.   Respiratory: Negative for chest tightness and shortness of breath.   Cardiovascular: Negative for leg swelling.  Gastrointestinal: Negative for abdominal pain.  Endocrine: Negative.   Genitourinary: Negative.   Musculoskeletal: Negative.   Hematological: Negative.   Psychiatric/Behavioral: Negative.     No past medical history on file.  No past surgical history on file.    Current Outpatient Medications:  .  cetirizine (ZYRTEC) 10 MG tablet, Take 1 tablet (10 mg total) by mouth daily., Disp: 30 tablet, Rfl: 0 .  Doxycycline Hyclate 50 MG TABS, Take 1 tablet by mouth 2 (two) times daily after a meal., Disp: 20 tablet, Rfl: 1 .  fluticasone (FLONASE) 50 MCG/ACT nasal spray, Place 1 spray into both nostrils daily., Disp: 16 g, Rfl: 2 .  HYDROcodone-acetaminophen (NORCO/VICODIN) 5-325 MG tablet, Take 1-2 tablets by mouth every 6 (six) hours as needed., Disp: 8 tablet, Rfl: 0 .  ibuprofen (ADVIL) 800 MG tablet, Take 1 tablet (800 mg total) by mouth every 8 (eight) hours as needed., Disp: 21 tablet, Rfl: 0   Objective:   Vitals:   10/23/18 0917  BP: 130/85  Pulse: 74  Temp:  98.8 F (37.1 C)  SpO2: 100%    Physical Exam Vitals signs and nursing note reviewed.  HENT:     Head: Normocephalic and atraumatic.   Cardiovascular:     Rate and Rhythm: Normal rate.  Pulmonary:     Effort: Pulmonary effort is normal.  Abdominal:     General: Abdomen is flat. There is no distension.  Skin:    General: Skin is warm.  Neurological:     General: No focal deficit present.     Mental Status: She is alert and oriented to person, place, and time.  Psychiatric:        Mood and Affect: Mood normal.        Behavior: Behavior normal.        Thought Content: Thought content normal.     Assessment & Plan:  Sebaceous cyst  Recommend excision of multiple facial cyst.  Recommend doing this in the operating room since the left periorbital area is so close to the eye.  I think it will be difficult to remove under local. Pictures were obtained of the patient and placed in the chart with the patient's or guardian's permission.   [place], DO

## 2018-10-23 NOTE — Progress Notes (Signed)
Can you give me a quote for this? The patient does not have insurance.

## 2019-06-29 ENCOUNTER — Other Ambulatory Visit: Payer: Self-pay

## 2019-06-30 ENCOUNTER — Ambulatory Visit (INDEPENDENT_AMBULATORY_CARE_PROVIDER_SITE_OTHER): Payer: 59 | Admitting: Nurse Practitioner

## 2019-06-30 ENCOUNTER — Encounter: Payer: Self-pay | Admitting: Nurse Practitioner

## 2019-06-30 ENCOUNTER — Other Ambulatory Visit (HOSPITAL_COMMUNITY)
Admission: RE | Admit: 2019-06-30 | Discharge: 2019-06-30 | Disposition: A | Discharge status: Home / Self Care | Payer: 59 | Source: Ambulatory Visit | Attending: Nurse Practitioner | Admitting: Nurse Practitioner

## 2019-06-30 VITALS — BP 102/62 | HR 79 | Temp 97.6°F | Ht 64.57 in | Wt 157.2 lb

## 2019-06-30 DIAGNOSIS — Z0001 Encounter for general adult medical examination with abnormal findings: Secondary | ICD-10-CM | POA: Diagnosis present

## 2019-06-30 DIAGNOSIS — Z0000 Encounter for general adult medical examination without abnormal findings: Secondary | ICD-10-CM | POA: Diagnosis not present

## 2019-06-30 DIAGNOSIS — Z30013 Encounter for initial prescription of injectable contraceptive: Secondary | ICD-10-CM

## 2019-06-30 DIAGNOSIS — Z136 Encounter for screening for cardiovascular disorders: Secondary | ICD-10-CM | POA: Diagnosis not present

## 2019-06-30 DIAGNOSIS — N920 Excessive and frequent menstruation with regular cycle: Secondary | ICD-10-CM | POA: Diagnosis not present

## 2019-06-30 DIAGNOSIS — Z13220 Encounter for screening for lipoid disorders: Secondary | ICD-10-CM

## 2019-06-30 DIAGNOSIS — Z124 Encounter for screening for malignant neoplasm of cervix: Secondary | ICD-10-CM | POA: Diagnosis not present

## 2019-06-30 DIAGNOSIS — D500 Iron deficiency anemia secondary to blood loss (chronic): Secondary | ICD-10-CM

## 2019-06-30 DIAGNOSIS — N852 Hypertrophy of uterus: Secondary | ICD-10-CM

## 2019-06-30 LAB — LIPID PANEL
Cholesterol: 224 mg/dL — ABNORMAL HIGH (ref 0–200)
HDL: 51.9 mg/dL (ref >39.00)
LDL Cholesterol: 154 mg/dL — ABNORMAL HIGH (ref 0–99)
NonHDL: 172.08
Total CHOL/HDL Ratio: 4
Triglycerides: 91 mg/dL (ref 0.0–149.0)
VLDL: 18.2 mg/dL (ref 0.0–40.0)

## 2019-06-30 LAB — CBC WITH DIFFERENTIAL/PLATELET
Basophils Absolute: 0.1 10*3/uL (ref 0.0–0.1)
Basophils Relative: 1.2 % (ref 0.0–3.0)
Eosinophils Absolute: 0.1 10*3/uL (ref 0.0–0.7)
Eosinophils Relative: 3.3 % (ref 0.0–5.0)
HCT: 36.4 % (ref 36.0–46.0)
Hemoglobin: 12.1 g/dL (ref 12.0–15.0)
Lymphocytes Relative: 39.6 % (ref 12.0–46.0)
Lymphs Abs: 1.7 10*3/uL (ref 0.7–4.0)
MCHC: 33.3 g/dL (ref 30.0–36.0)
MCV: 95.3 fl (ref 78.0–100.0)
Monocytes Absolute: 0.6 10*3/uL (ref 0.1–1.0)
Monocytes Relative: 13.5 % — ABNORMAL HIGH (ref 3.0–12.0)
Neutro Abs: 1.8 10*3/uL (ref 1.4–7.7)
Neutrophils Relative %: 42.4 % — ABNORMAL LOW (ref 43.0–77.0)
Platelets: 293 10*3/uL (ref 150.0–400.0)
RBC: 3.81 Mil/uL — ABNORMAL LOW (ref 3.87–5.11)
RDW: 14.8 % (ref 11.5–15.5)
WBC: 4.2 10*3/uL (ref 4.0–10.5)

## 2019-06-30 LAB — TSH: TSH: 1.03 u[IU]/mL (ref 0.35–4.50)

## 2019-06-30 LAB — COMPREHENSIVE METABOLIC PANEL
ALT: 12 U/L (ref 0–35)
AST: 11 U/L (ref 0–37)
Albumin: 4 g/dL (ref 3.5–5.2)
Alkaline Phosphatase: 82 U/L (ref 39–117)
BUN: 10 mg/dL (ref 6–23)
CO2: 24 mEq/L (ref 19–32)
Calcium: 9.1 mg/dL (ref 8.4–10.5)
Chloride: 108 mEq/L (ref 96–112)
Creatinine, Ser: 0.79 mg/dL (ref 0.40–1.20)
GFR: 96.69 mL/min (ref >60.00)
Glucose, Bld: 90 mg/dL (ref 70–99)
Potassium: 4.7 mEq/L (ref 3.5–5.1)
Sodium: 137 mEq/L (ref 135–145)
Total Bilirubin: 0.2 mg/dL (ref 0.2–1.2)
Total Protein: 7.1 g/dL (ref 6.0–8.3)

## 2019-06-30 MED ORDER — MEDROXYPROGESTERONE ACETATE 150 MG/ML IM SUSP
150.0000 mg | Freq: Once | INTRAMUSCULAR | Status: AC
  Administered 2019-06-30: 150 mg via INTRAMUSCULAR

## 2019-06-30 MED ORDER — MEDROXYPROGESTERONE ACETATE 150 MG/ML IM SUSP: 150.0000 mg | Freq: Once | INTRAMUSCULAR | 0 refills | Status: DC

## 2019-06-30 NOTE — Progress Notes (Signed)
Subjective:    Patient ID: Lori Hughes, female    DOB: 1975-06-21, 44 y.o.   MRN: [id number]  Patient presents today for complete physical and eval of anemia, contraception and menorrhea  HPI  Sexual History (orientation,birth control, marital status, STD):not sexually active at this time, has regular but heavy cycles with clots, will like to start oral contracption to manage cycle Current tobacco use  Depression/Suicide: Depression screen [facility] 2/9 06/30/2019  Decreased Interest 0  Down, Depressed, Hopeless 0  PHQ - 2 Score 0   Vision:will schedule  Dental:will schedule  Immunizations: (TDAP, Hep C screen, Pneumovax, Influenza, zoster)  Health Maintenance  Topic Date Due  . COVID-19 Vaccine (1) Never done  . Tetanus Vaccine  Never done  . HIV Screening  06/29/2020*  . Flu Shot  09/12/2019  . Pap Smear  06/30/2022  *Topic was postponed. The date shown is not the original due date.   Diet:regular  Weight:  Wt Readings from Last 3 Encounters:  06/30/19 157 lb 3.2 oz (71.3 kg)  10/23/18 157 lb (71.2 kg)  10/09/18 145 lb (65.8 kg)    Exercise:none  Fall Risk: Fall Risk  06/30/2019  Falls in the past year? 0  Number falls in past yr: 0  Injury with Fall? 0   Advanced Directive: Advanced Directives 10/09/2018  Does Patient Have a Medical Advance Directive? No  Would patient like information on creating a medical advance directive? No - Patient declined     Medications and allergies reviewed with patient and updated if appropriate.  Patient Active Problem List   Diagnosis Date Noted  . Sebaceous cyst 10/23/2018    No current outpatient medications on file prior to visit.   No current facility-administered medications on file prior to visit.    History reviewed. No pertinent past medical history.  History reviewed. No pertinent surgical history.  Social History   Socioeconomic History  . Marital status: Single    Spouse name: Not on file  . Number  of children: 0  . Years of education: Not on file  . Highest education level: Not on file  Occupational History  . Not on file  Tobacco Use  . Smoking status: Current Every Day Smoker    Packs/day: 1.50    Types: Cigarettes  . Smokeless tobacco: Never Used  Substance and Sexual Activity  . Alcohol use: Never  . Drug use: Never  . Sexual activity: Not Currently    Birth control/protection: Abstinence  Other Topics Concern  . Not on file  Social History Narrative  . Not on file   Social Determinants of Health   Financial Resource Strain:   . Difficulty of Paying Living Expenses:   Food Insecurity:   . Worried About Charity fundraiser in the Last Year:   . Arboriculturist in the Last Year:   Transportation Needs:   . Film/video editor (Medical):   Marland Kitchen Lack of Transportation (Non-Medical):   Physical Activity:   . Days of Exercise per Week:   . Minutes of Exercise per Session:   Stress:   . Feeling of Stress :   Social Connections:   . Frequency of Communication with Friends and Family:   . Frequency of Social Gatherings with Friends and Family:   . Attends Religious Services:   . Active Member of Clubs or Organizations:   . Attends Archivist Meetings:   Marland Kitchen Marital Status:     Family History  Problem  Relation Age of Onset  . Hypertension Mother   . Hypertension Father   . Cancer Maternal Grandmother 49       colon  . Cancer Paternal Grandmother        prostate        Review of Systems  Constitutional: Negative for fever, malaise/fatigue and weight loss.  HENT: Negative for congestion and sore throat.   Eyes:       Negative for visual changes  Respiratory: Negative for cough and shortness of breath.   Cardiovascular: Negative for chest pain, palpitations and leg swelling.  Gastrointestinal: Negative for blood in stool, constipation, diarrhea and heartburn.  Genitourinary: Negative for dysuria, frequency and urgency.  Musculoskeletal: Negative  for falls, joint pain and myalgias.  Skin: Negative for rash.  Neurological: Negative for dizziness, sensory change and headaches.  Endo/Heme/Allergies: Does not bruise/bleed easily.  Psychiatric/Behavioral: Negative for depression, substance abuse and suicidal ideas. The patient is not nervous/anxious.    Objective:   Vitals:   06/30/19 1307  BP: 102/62  Pulse: 79  Temp: 97.6 F (36.4 C)  SpO2: 97%   Body mass index is 26.51 kg/m.  Physical Examination:  Physical Exam Vitals reviewed. Exam conducted with a chaperone present.  Constitutional:      General: She is not in acute distress. HENT:     Right Ear: Tympanic membrane, ear canal and external ear normal.     Left Ear: Tympanic membrane, ear canal and external ear normal.  Eyes:     General: No scleral icterus.    Extraocular Movements: Extraocular movements intact.     Conjunctiva/sclera: Conjunctivae normal.  Neck:     Thyroid: No thyromegaly.  Cardiovascular:     Rate and Rhythm: Normal rate and regular rhythm.     Pulses: Normal pulses.     Heart sounds: Normal heart sounds.  Pulmonary:     Effort: Pulmonary effort is normal.     Breath sounds: Normal breath sounds.  Chest:     Breasts: Breasts are symmetrical.        Right: Normal. No mass, nipple discharge or skin change.        Left: Normal. No mass, nipple discharge or skin change.  Abdominal:     General: Bowel sounds are normal. There is no distension.     Palpations: Abdomen is soft.     Tenderness: There is no abdominal tenderness.     Hernia: There is no hernia in the left inguinal area or right inguinal area.  Genitourinary:    Labia:        Right: No rash or tenderness.        Left: No rash or tenderness.      Vagina: Normal. No vaginal discharge, erythema or tenderness.     Cervix: Normal.     Uterus: Enlarged. Not tender.      Adnexa: Right adnexa normal and left adnexa normal.  Musculoskeletal:        General: No tenderness. Normal  range of motion.     Cervical back: Normal range of motion and neck supple.  Lymphadenopathy:     Cervical: No cervical adenopathy.     Upper Body:     Right upper body: No supraclavicular, axillary or pectoral adenopathy.     Left upper body: No supraclavicular, axillary or pectoral adenopathy.     Lower Body: No right inguinal adenopathy. No left inguinal adenopathy.  Skin:    General: Skin is warm and dry.  Neurological:     Mental Status: She is alert and oriented to person, place, and time.  Psychiatric:        Mood and Affect: Mood normal.        Behavior: Behavior normal.        Thought Content: Thought content normal.        Judgment: Judgment normal.     ASSESSMENT and PLAN: This visit occurred during the SARS-CoV-2 public health emergency.  Safety protocols were in place, including screening questions prior to the visit, additional usage of staff PPE, and extensive cleaning of exam room while observing appropriate contact time as indicated for disinfecting solutions.   Lori Hughes was seen today for establish care.  Diagnoses and all orders for this visit:  Encounter for preventative adult health care exam with abnormal findings -     Lipid panel -     CBC with Differential/Platelet -     Comprehensive metabolic panel -     TSH -     Cytology - PAP( [place])  Enlarged uterus -     US Pelvic Complete With Transvaginal; Future  Menorrhagia with regular cycle -     Iron, TIBC and Ferritin Panel -     US Pelvic Complete With Transvaginal; Future -     medroxyPROGESTERone (DEPO-PROVERA) 150 MG/ML injection; Inject 1 mL (150 mg total) into the muscle once for 1 dose. -     ferrous sulfate 324 (65 Fe) MG TBEC; Take 1 tablet (325 mg total) by mouth 3 (three) times daily after meals. -     Iron, TIBC and Ferritin Panel; Future  Encounter for lipid screening for cardiovascular disease -     Lipid panel  Encounter for initial prescription of injectable contraceptive -      POCT urine pregnancy -     medroxyPROGESTERone (DEPO-PROVERA) 150 MG/ML injection; Inject 1 mL (150 mg total) into the muscle once for 1 dose. -     medroxyPROGESTERone (DEPO-PROVERA) injection 150 mg  Encounter for Papanicolaou smear for cervical cancer screening -     Cytology - PAP( [place])  Iron deficiency anemia due to chronic blood loss -     ferrous sulfate 324 (65 Fe) MG TBEC; Take 1 tablet (325 mg total) by mouth 3 (three) times daily after meals. -     Iron, TIBC and Ferritin Panel; Future    No problem-specific Assessment & Plan notes found for this encounter.      Problem List Items Addressed This Visit    None    Visit Diagnoses    Encounter for preventative adult health care exam with abnormal findings    -  Primary   Relevant Orders   Lipid panel (Completed)   CBC with Differential/Platelet (Completed)   Comprehensive metabolic panel (Completed)   TSH (Completed)   Cytology - PAP( [place]) (Completed)   Enlarged uterus       Relevant Orders   US Pelvic Complete With Transvaginal   Menorrhagia with regular cycle       Relevant Medications   ferrous sulfate 324 (65 Fe) MG TBEC   Other Relevant Orders   Iron, TIBC and Ferritin Panel (Completed)   US Pelvic Complete With Transvaginal   Iron, TIBC and Ferritin Panel   Encounter for lipid screening for cardiovascular disease       Relevant Orders   Lipid panel (Completed)   Encounter for initial prescription of injectable contraceptive  Relevant Medications   medroxyPROGESTERone (DEPO-PROVERA) injection 150 mg (Completed)   Other Relevant Orders   POCT urine pregnancy   Encounter for Papanicolaou smear for cervical cancer screening       Relevant Orders   Cytology - PAP( [place]) (Completed)   Iron deficiency anemia due to chronic blood loss       Relevant Medications   ferrous sulfate 324 (65 Fe) MG TBEC   Other Relevant Orders   Iron, TIBC and Ferritin Panel       Follow up:  Return in about 1 year (around 06/29/2020) for CPE.  Wilfred Lacy, NP

## 2019-06-30 NOTE — Patient Instructions (Addendum)
Go to lab for blood draw Negative urine pregnancy You get your first dose of DepoProvera today. You need to return to office every 81months.  You will be contacted to schedule appt for pelvic US.  I strongly encourage you to stop tobacco use.  Health Maintenance, Female Adopting a healthy lifestyle and getting preventive care are important in promoting health and wellness. Ask your health care provider about:  The right schedule for you to have regular tests and exams.  Things you can do on your own to prevent diseases and keep yourself healthy. What should I know about diet, weight, and exercise? Eat a healthy diet   Eat a diet that includes plenty of vegetables, fruits, low-fat dairy products, and lean protein.  Do not eat a lot of foods that are high in solid fats, added sugars, or sodium. Maintain a healthy weight Body mass index (BMI) is used to identify weight problems. It estimates body fat based on height and weight. Your health care provider can help determine your BMI and help you achieve or maintain a healthy weight. Get regular exercise Get regular exercise. This is one of the most important things you can do for your health. Most adults should:  Exercise for at least 150 minutes each week. The exercise should increase your heart rate and make you sweat (moderate-intensity exercise).  Do strengthening exercises at least twice a week. This is in addition to the moderate-intensity exercise.  Spend less time sitting. Even light physical activity can be beneficial. Watch cholesterol and blood lipids Have your blood tested for lipids and cholesterol at 42 years of age, then have this test every 5 years. Have your cholesterol levels checked more often if:  Your lipid or cholesterol levels are high.  You are older than 43 years of age.  You are at high risk for heart disease. What should I know about cancer screening? Depending on your health history and family history,  you may need to have cancer screening at various ages. This may include screening for:  Breast cancer.  Cervical cancer.  Colorectal cancer.  Skin cancer.  Lung cancer. What should I know about heart disease, diabetes, and high blood pressure? Blood pressure and heart disease  High blood pressure causes heart disease and increases the risk of stroke. This is more likely to develop in people who have high blood pressure readings, are of African descent, or are overweight.  Have your blood pressure checked: ? Every 3-5 years if you are 74-83 years of age. ? Every year if you are 18 years old or older. Diabetes Have regular diabetes screenings. This checks your fasting blood sugar level. Have the screening done:  Once every three years after age 5 if you are at a normal weight and have a low risk for diabetes.  More often and at a younger age if you are overweight or have a high risk for diabetes. What should I know about preventing infection? Hepatitis B If you have a higher risk for hepatitis B, you should be screened for this virus. Talk with your health care provider to find out if you are at risk for hepatitis B infection. Hepatitis C Testing is recommended for:  Everyone born from [birth year] through 1963.  Anyone with known risk factors for hepatitis C. Sexually transmitted infections (STIs)  Get screened for STIs, including gonorrhea and chlamydia, if: ? You are sexually active and are younger than 42 years of age. ? You are older than 42 years  of age and your health care provider tells you that you are at risk for this type of infection. ? Your sexual activity has changed since you were last screened, and you are at increased risk for chlamydia or gonorrhea. Ask your health care provider if you are at risk.  Ask your health care provider about whether you are at high risk for HIV. Your health care provider may recommend a prescription medicine to help prevent HIV infection.  If you choose to take medicine to prevent HIV, you should first get tested for HIV. You should then be tested every 3 months for as long as you are taking the medicine. Pregnancy  If you are about to stop having your period (premenopausal) and you may become pregnant, seek counseling before you get pregnant.  Take 400 to 800 micrograms (mcg) of folic acid every day if you become pregnant.  Ask for birth control (contraception) if you want to prevent pregnancy. Osteoporosis and menopause Osteoporosis is a disease in which the bones lose minerals and strength with aging. This can result in bone fractures. If you are 21 years old or older, or if you are at risk for osteoporosis and fractures, ask your health care provider if you should:  Be screened for bone loss.  Take a calcium or vitamin D supplement to lower your risk of fractures.  Be given hormone replacement therapy (HRT) to treat symptoms of menopause. Follow these instructions at home: Lifestyle  Do not use any products that contain nicotine or tobacco, such as cigarettes, e-cigarettes, and chewing tobacco. If you need help quitting, ask your health care provider.  Do not use street drugs.  Do not share needles.  Ask your health care provider for help if you need support or information about quitting drugs. Alcohol use  Do not drink alcohol if: ? Your health care provider tells you not to drink. ? You are pregnant, may be pregnant, or are planning to become pregnant.  If you drink alcohol: ? Limit how much you use to 0-1 drink a day. ? Limit intake if you are breastfeeding.  Be aware of how much alcohol is in your drink. In the [place] one drink equals one 12 oz bottle of beer (355 mL), one 5 oz glass of wine (148 mL), or one 1 oz glass of hard liquor (44 mL). General instructions  Schedule regular health, dental, and eye exams.  Stay current with your vaccines.  Tell your health care provider if: ? You often feel  depressed. ? You have ever been abused or do not feel safe at home. Summary  Adopting a healthy lifestyle and getting preventive care are important in promoting health and wellness.  Follow your health care provider's instructions about healthy diet, exercising, and getting tested or screened for diseases.  Follow your health care provider's instructions on monitoring your cholesterol and blood pressure. This information is not intended to replace advice given to you by your health care provider. Make sure you discuss any questions you have with your health care provider. Document Revised: 01/21/2018 Document Reviewed: 01/21/2018 Elsevier Patient Education  2020 [employer].

## 2019-07-01 LAB — IRON,TIBC AND FERRITIN PANEL
%SAT: 6 % (calc) — ABNORMAL LOW (ref 16–45)
Ferritin: 3 ng/mL — ABNORMAL LOW (ref 16–232)
Iron: 23 ug/dL — ABNORMAL LOW (ref 40–190)
TIBC: 417 mcg/dL (calc) (ref 250–450)

## 2019-07-02 LAB — CYTOLOGY - PAP
Comment: NEGATIVE
Diagnosis: NEGATIVE
High risk HPV: NEGATIVE

## 2019-07-02 LAB — POCT URINE PREGNANCY: Preg Test, Ur: NEGATIVE

## 2019-07-02 MED ORDER — FERROUS SULFATE 324 (65 FE) MG PO TBEC: 1.0000 | DELAYED_RELEASE_TABLET | Freq: Three times a day (TID) | ORAL | 5 refills | Status: DC

## 2019-07-13 ENCOUNTER — Ambulatory Visit
Admission: RE | Admit: 2019-07-13 | Discharge: 2019-07-13 | Disposition: A | Discharge status: Home / Self Care | Payer: 59 | Source: Ambulatory Visit | Attending: Nurse Practitioner | Admitting: Nurse Practitioner

## 2019-07-13 DIAGNOSIS — N920 Excessive and frequent menstruation with regular cycle: Secondary | ICD-10-CM

## 2019-07-13 DIAGNOSIS — N852 Hypertrophy of uterus: Secondary | ICD-10-CM

## 2019-07-14 ENCOUNTER — Other Ambulatory Visit: Payer: Self-pay | Admitting: Nurse Practitioner

## 2019-07-14 DIAGNOSIS — D250 Submucous leiomyoma of uterus: Secondary | ICD-10-CM

## 2019-07-28 ENCOUNTER — Other Ambulatory Visit: Payer: Self-pay

## 2019-07-29 ENCOUNTER — Encounter: Payer: Self-pay | Admitting: Obstetrics and Gynecology

## 2019-07-29 ENCOUNTER — Ambulatory Visit (INDEPENDENT_AMBULATORY_CARE_PROVIDER_SITE_OTHER): Payer: 59 | Admitting: Obstetrics and Gynecology

## 2019-07-29 VITALS — BP 118/76 | Ht 63.0 in | Wt 153.0 lb

## 2019-07-29 DIAGNOSIS — N920 Excessive and frequent menstruation with regular cycle: Secondary | ICD-10-CM

## 2019-07-29 DIAGNOSIS — D252 Subserosal leiomyoma of uterus: Secondary | ICD-10-CM

## 2019-07-29 DIAGNOSIS — N939 Abnormal uterine and vaginal bleeding, unspecified: Secondary | ICD-10-CM

## 2019-07-29 NOTE — Progress Notes (Signed)
[facility] Jul 14, 1975 [id number]  SUBJECTIVE:  44 y.o. G82P0010 female presents as a new referral for fibroid uterus with anemia.  She has chronically heavy periods.  She says her period typically lasts about 8 days, she has to change a pad and tampon every 1 to 1.5 hours for the first few days of the period then frequency of pad changes decreases to about 3 every 3-4 hours for the duration of the period.  On her period she gets significant back pains and cramping but otherwise day-to-day she denies any abdominal pain, pressure, bloating, appetite changes, bowel movement changes, nausea.  She saw her primary care provider and a pelvic ultrasound was ordered and performed 07/13/2019 with findings of a retroverted enlarged uterus 12.7 x 8.7 x 9.8 cm, there is a fundal leiomyoma 5.6 cm largest dimension, also an anterior subserosal leiomyoma 6.0 cm in largest dimension.  Endometrium was 6 mm.  Both ovaries were normal.  Recent labs included normal TSH, CBC with hemoglobin 12.1, and a normal Pap smear 06/30/2019.  She was just started on Depo-Provera about 2 weeks ago and has had on and off vaginal bleeding, but denies excessive/heavy bleeding.  No cramping or pain with this current bleeding.   Current Outpatient Medications  Medication Sig Dispense Refill  . ferrous sulfate 324 (65 Fe) MG TBEC Take 1 tablet (325 mg total) by mouth 3 (three) times daily after meals. 90 tablet 5  . medroxyPROGESTERone (DEPO-PROVERA) 150 MG/ML injection Inject 1 mL (150 mg total) into the muscle once for 1 dose. 1 mL 0   No current facility-administered medications for this visit.   Allergies: Patient has no known allergies.  Patient's last menstrual period was 07/15/2019.  Past medical history,surgical history, problem list, medications, allergies, family history and social history were all reviewed and documented as reviewed in the EPIC chart.  ROS:  Feeling well. No dyspnea or chest pain on exertion.  No  abdominal pain, change in bowel habits, black or bloody stools.  No urinary tract symptoms. GYN ROS: Heavy periods as described above, no significant pelvic pain or discharge  OBJECTIVE:  Ht 5\' 3"  (1.6 m)   Wt 153 lb (69.4 kg)   LMP 07/15/2019   BMI 27.10 kg/m  The patient appears well, alert, oriented x 3, in no distress. Abdomen: Soft, nondistended, nontender, no palpable masses PELVIC EXAM: Deferred   ASSESSMENT:  44 y.o. G1P0010 here for consultation regarding a fibroid uterus with abnormal uterine bleeding/menorrhagia, dysmenorrhea  PLAN:  The fibroids identified on her pelvic ultrasound are of a medium size.  Pelvic ultrasound imaging is independently reviewed today.  They are in an subserosal location less likely to be contributing to abnormal uterine bleeding, but it might also play a role in the pelvic cramping and dysmenorrhea that she has experienced.  I do not see any prior imaging to indicate whether not this fibroid is new or not.  We spent time today discussing fibroids and how they are a vast majority of the time benign findings.  No specific follow-up is necessary for an asymptomatic or mildly symptomatic fibroid other than a yearly pelvic exam to see if there is any interval growth.  I did discuss some of the symptoms that would potentially indicate rapid growth of the fibroid such as increased pelvic pressure, discomfort, changes in bowel habits, or increased vaginal bleeding and/or heavier periods.  Should she experience these symptoms she should come back for evaluation sooner.  Even with fibroids that do grow  rapidly, malignancy is very uncommon.  I discussed that as patients enter menopause fibroids are expected to stabilize in size and usually shrink.  Diagrams were used to help illustrate our discussion today.  She recently had a normal TSH and Pap smear to rule out those potential associated causes of abnormal uterine bleeding/menorrhagia.  She was not anemic at her last  CBC recently here.  I would recommend that she continue with iron supplementation as prescribed by her primary care provider.  Reasonable initial management for minimally symptomatic fibroids includes trial of hormonal contraception, use of scheduled NSAIDs (ibuprofen or naproxen) starting the day before expecting the period to begin and continuing scheduled for at least 5 days.  She recently started on Depo-Provera which is a reasonable choice and she is currently having the expected abnormal uterine bleeding without heavy bleeding that is expected with starting this birth control method, hopefully this will clear up in the next several weeks to few months.  Other contraceptive options would be available as well if need be.  If conservative management therapies fail, other procedural management options include referral to interventional radiology for uterine artery embolization (if not planning any future pregnancies), myomectomy, or hysterectomy as definitive therapy.  We also will be soon offering the the ablative treatment of fibroids here in the near future.  I provided her with printouts on abnormal uterine bleeding and uterine fibroids at checkout.  She will review the information and let us know if she has any questions or concerns or if any worsening of her symptoms occur.   Joseph Pierini MD 07/29/19

## 2019-07-29 NOTE — Patient Instructions (Signed)
Abnormal Uterine Bleeding Abnormal uterine bleeding is unusual bleeding from the uterus. It includes:  Bleeding or spotting between periods.  Bleeding after sex.  Bleeding that is heavier than normal.  Periods that last longer than usual.  Bleeding after menopause. Abnormal uterine bleeding can affect women at various stages in life, including teenagers, women in their reproductive years, pregnant women, and women who have reached menopause. Common causes of abnormal uterine bleeding include:  Pregnancy.  Growths of tissue (polyps).  A noncancerous tumor in the uterus (fibroid).  Infection.  Cancer.  Hormonal imbalances. Any type of abnormal bleeding should be evaluated by a health care provider. Many cases are minor and simple to treat, while others are more serious. Treatment will depend on the cause of the bleeding. Follow these instructions at home:  Monitor your condition for any changes.  Do not use tampons, douche, or have sex if told by your health care provider.  Change your pads often.  Get regular exams that include pelvic exams and cervical cancer screening.  Keep all follow-up visits as told by your health care provider. This is important. Contact a health care provider if:  Your bleeding lasts for more than one week.  You feel dizzy at times.  You feel nauseous or you vomit. Get help right away if:  You pass out.  Your bleeding soaks through a pad every hour.  You have abdominal pain.  You have a fever.  You become sweaty or weak.  You pass large blood clots from your vagina. Summary  Abnormal uterine bleeding is unusual bleeding from the uterus.  Any type of abnormal bleeding should be evaluated by a health care provider. Many cases are minor and simple to treat, while others are more serious.  Treatment will depend on the cause of the bleeding. This information is not intended to replace advice given to you by your health care provider.  Make sure you discuss any questions you have with your health care provider. Document Revised: 05/07/2017 Document Reviewed: 03/01/2016 Elsevier Patient Education  2020 [employer] Inc[employer]  Uterine Fibroids  Uterine fibroids (leiomyomas) are noncancerous (benign) tumors that can develop in the uterus. Fibroids may also develop in the fallopian tubes, cervix, or tissues (ligaments) near the uterus. You may have one or many fibroids. Fibroids vary in size, weight, and where they grow in the uterus. Some can become quite large. Most fibroids do not require medical treatment. What are the causes? The cause of this condition is not known. What increases the risk? You are more likely to develop this condition if you:  Are in your 30s or 40s and have not gone through menopause.  Have a family history of this condition.  Are of African-American descent.  Had your first period at an early age (early menarche).  Have not had any children (nulliparity).  Are overweight or obese. What are the signs or symptoms? Many women do not have any symptoms. Symptoms of this condition may include:  Heavy menstrual bleeding.  Bleeding or spotting between periods.  Pain and pressure in the pelvic area, between the hips.  Bladder problems, such as needing to urinate urgently or more often than usual.  Inability to have children (infertility).  Failure to carry pregnancy to term (miscarriage). How is this diagnosed? This condition may be diagnosed based on:  Your symptoms and medical history.  A physical exam.  A pelvic exam that includes feeling for any tumors.  Imaging tests, such as ultrasound or MRI. How   is this treated? Treatment for this condition may include:  Seeing your health care provider for follow-up visits to monitor your fibroids for any changes.  Taking NSAIDs such as ibuprofen, naproxen, or aspirin to reduce pain.  Hormone medicines. These may be taken as a pill, given in an  injection, or delivered by a T-shaped device that is inserted into the uterus (intrauterine device, IUD).  Surgery to remove one of the following: ? The fibroids (myomectomy). Your health care provider may recommend this if fibroids affect your fertility and you want to become pregnant. ? The uterus (hysterectomy). ? Blood supply to the fibroids (uterine artery embolization). Follow these instructions at home:  Take over-the-counter and prescription medicines only as told by your health care provider.  Ask your health care provider if you should take iron pills or eat more iron-rich foods, such as dark green, leafy vegetables. Heavy menstrual bleeding can cause low iron levels.  If directed, apply heat to your back or abdomen to reduce pain. Use the heat source that your health care provider recommends, such as a moist heat pack or a heating pad. ? Place a towel between your skin and the heat source. ? Leave the heat on for 20-30 minutes. ? Remove the heat if your skin turns bright red. This is especially important if you are unable to feel pain, heat, or cold. You may have a greater risk of getting burned.  Pay close attention to your menstrual cycle. Tell your health care provider about any changes, such as: ? Increased blood flow that requires you to use more pads or tampons than usual. ? A change in the number of days that your period lasts. ? A change in symptoms that are associated with your period, such as back pain or cramps in your abdomen.  Keep all follow-up visits as told by your health care provider. This is important, especially if your fibroids need to be monitored for any changes. Contact a health care provider if you:  Have pelvic pain, back pain, or cramps in your abdomen that do not get better with medicine or heat.  Develop new bleeding between periods.  Have increased bleeding during or between periods.  Feel unusually tired or weak.  Feel light-headed. Get help  right away if you:  Faint.  Have pelvic pain that suddenly gets worse.  Have severe vaginal bleeding that soaks a tampon or pad in 30 minutes or less. Summary  Uterine fibroids are noncancerous (benign) tumors that can develop in the uterus.  The exact cause of this condition is not known.  Most fibroids do not require medical treatment unless they affect your ability to have children (fertility).  Contact a health care provider if you have pelvic pain, back pain, or cramps in your abdomen that do not get better with medicines.  Make sure you know what symptoms should cause you to get help right away. This information is not intended to replace advice given to you by your health care provider. Make sure you discuss any questions you have with your health care provider. Document Revised: 01/10/2017 Document Reviewed: 12/24/2016 Elsevier Patient Education  2020 [employer] Inc[employer]  

## 2019-09-28 ENCOUNTER — Ambulatory Visit: Payer: 59

## 2019-09-29 ENCOUNTER — Other Ambulatory Visit: Payer: Self-pay

## 2019-09-30 ENCOUNTER — Ambulatory Visit (INDEPENDENT_AMBULATORY_CARE_PROVIDER_SITE_OTHER): Payer: 59 | Admitting: Nurse Practitioner

## 2019-09-30 DIAGNOSIS — Z789 Other specified health status: Secondary | ICD-10-CM

## 2019-09-30 LAB — POCT URINE PREGNANCY: Preg Test, Ur: NEGATIVE

## 2019-09-30 MED ORDER — MEDROXYPROGESTERONE ACETATE 150 MG/ML IM SUSP
150.0000 mg | Freq: Once | INTRAMUSCULAR | Status: AC
  Administered 2019-09-30: 150 mg via INTRAMUSCULAR

## 2019-09-30 NOTE — Progress Notes (Signed)
Patient came in and received Depo-Provera injection in her right delt, and tolerated injection well.

## 2019-09-30 NOTE — Progress Notes (Signed)
POCT urine pregnancy ordered. Due for 2nd Depo-Provera injection. Patient educated about medical necessity for test and verbalized understanding. Patient also educated that a POCT urine pregnancy will be required if there are more than 98 days between injections. Patient verbalized understanding.

## 2019-10-04 ENCOUNTER — Ambulatory Visit (INDEPENDENT_AMBULATORY_CARE_PROVIDER_SITE_OTHER): Payer: 59 | Admitting: Nurse Practitioner

## 2019-10-04 ENCOUNTER — Other Ambulatory Visit: Payer: Self-pay

## 2019-10-04 ENCOUNTER — Encounter: Payer: Self-pay | Admitting: Nurse Practitioner

## 2019-10-04 VITALS — BP 110/80 | HR 88 | Temp 97.8°F | Ht 63.0 in | Wt 160.0 lb

## 2019-10-04 DIAGNOSIS — R3911 Hesitancy of micturition: Secondary | ICD-10-CM

## 2019-10-04 LAB — POCT URINALYSIS DIPSTICK
Bilirubin, UA: NEGATIVE
Blood, UA: NEGATIVE
Glucose, UA: NEGATIVE
Nitrite, UA: NEGATIVE
Protein, UA: NEGATIVE
Spec Grav, UA: >=1.03 — AB (ref 1.010–1.025)
Urobilinogen, UA: 0.2 E.U./dL
pH, UA: 6 (ref 5.0–8.0)

## 2019-10-04 NOTE — Patient Instructions (Signed)
Urine indicates need for increase oral hydration. No obvious sign of UTI The goal is to drink 60-70oz per day. Avoid sodas and juices. Urine sent for culture.

## 2019-10-04 NOTE — Progress Notes (Signed)
   Subjective:  Patient ID: Lori Hughes, female    DOB: [birth year]  Age: 44 y.o. MRN: [id number]  CC: Follow-up (Pt c/o being unable to urinate more than once a day, pressure as if she has to urinate but only going a little, low left side back pain x1 week. )  Urinary Tract Infection  This is a new problem. The current episode started in the past 7 days. The problem has been unchanged. The pain is at a severity of 0/10. The patient is experiencing no pain. There has been no fever. She is not sexually active. There is no history of pyelonephritis. Associated symptoms include hesitancy. Pertinent negatives include no chills, frequency, possible pregnancy or urgency. She has tried increased fluids for the symptoms. The treatment provided mild relief. There is no history of recurrent UTIs or urinary stasis.  admits to inadequate oral hydration (1bottle of water per day) Use of depoprovera for contraception.  Reviewed past Medical, Social and Family history today.  Outpatient Medications Prior to Visit  Medication Sig Dispense Refill  . ferrous sulfate 324 (65 Fe) MG TBEC Take 1 tablet (325 mg total) by mouth 3 (three) times daily after meals. 90 tablet 5  . medroxyPROGESTERone (DEPO-PROVERA) 150 MG/ML injection Inject 1 mL (150 mg total) into the muscle once for 1 dose. 1 mL 0   No facility-administered medications prior to visit.    ROS See HPI  Objective:  BP 110/80 (BP Location: Left Arm, Patient Position: Sitting, Cuff Size: Normal)   Pulse 88   Temp 97.8 F (36.6 C) (Temporal)   Ht 5\' 3"  (1.6 m)   Wt 160 lb (72.6 kg)   LMP 08/11/2019 Comment: Periods stopped after depo injections began  SpO2 99%   BMI 28.34 kg/m   Physical Exam Constitutional:      General: She is not in acute distress. Abdominal:     General: There is no distension.     Tenderness: There is abdominal tenderness in the suprapubic area. There is no right CVA tenderness, left CVA tenderness or guarding.   Neurological:     Mental Status: She is alert.    Assessment & Plan:  This visit occurred during the SARS-CoV-2 public health emergency.  Safety protocols were in place, including screening questions prior to the visit, additional usage of staff PPE, and extensive cleaning of exam room while observing appropriate contact time as indicated for disinfecting solutions.   Lori Hughes was seen today for follow-up.  Diagnoses and all orders for this visit:  Urinary hesitancy -     POCT urinalysis dipstick -     Urine Culture    Problem List Items Addressed This Visit    None    Visit Diagnoses    Urinary hesitancy    -  Primary   Relevant Orders   POCT urinalysis dipstick (Completed)   Urine Culture      Follow-up: No follow-ups on file.  Wilfred Lacy, NP

## 2019-10-05 LAB — URINE CULTURE
MICRO NUMBER:: 10859245
Result:: NO GROWTH
SPECIMEN QUALITY:: ADEQUATE

## 2019-10-21 NOTE — Progress Notes (Signed)
Medical screening examination/treatment/procedure(s) were performed by the CMA. As primary care provider I was immediately available for consulation/collaboration. I agree with above documentation. Murry Diaz, AGNP-C 

## 2019-12-23 ENCOUNTER — Ambulatory Visit: Payer: 59

## 2019-12-30 ENCOUNTER — Other Ambulatory Visit: Payer: Self-pay

## 2019-12-30 ENCOUNTER — Ambulatory Visit (INDEPENDENT_AMBULATORY_CARE_PROVIDER_SITE_OTHER): Payer: 59

## 2019-12-30 DIAGNOSIS — Z789 Other specified health status: Secondary | ICD-10-CM

## 2019-12-30 MED ORDER — MEDROXYPROGESTERONE ACETATE 150 MG/ML IM SUSP
150.0000 mg | Freq: Once | INTRAMUSCULAR | Status: AC
  Administered 2019-12-30: 150 mg via INTRAMUSCULAR

## 2019-12-30 NOTE — Patient Instructions (Signed)
Patient came in gave verbal consent to  received Depo-Provera injection in her right delt, and tolerated injection well.

## 2019-12-30 NOTE — Progress Notes (Signed)
Patient came in gave verbal consent to  received Depo-Provera injection in her right delt, and tolerated injection well.

## 2019-12-30 NOTE — Progress Notes (Signed)
I reviewed and agree with the documentation and plan as outlined below.   

## 2020-02-14 DIAGNOSIS — F419 Anxiety disorder, unspecified: Secondary | ICD-10-CM | POA: Insufficient documentation

## 2020-02-14 DIAGNOSIS — Z8659 Personal history of other mental and behavioral disorders: Secondary | ICD-10-CM | POA: Insufficient documentation

## 2020-04-13 ENCOUNTER — Telehealth: Payer: Self-pay | Admitting: Nurse Practitioner

## 2020-04-13 NOTE — Telephone Encounter (Signed)
Pt called to schedule Depo shot bot her last one was 12/30/19 and she was supposed to come back between 03/16/20 and 03/30/20, What course of action does she need to take to get it scheduled again. Please advise Call back [phone]

## 2020-04-20 ENCOUNTER — Ambulatory Visit: Payer: 59

## 2020-04-21 ENCOUNTER — Ambulatory Visit: Payer: 59 | Admitting: Nurse Practitioner

## 2020-04-26 ENCOUNTER — Other Ambulatory Visit: Payer: Self-pay

## 2020-04-27 ENCOUNTER — Ambulatory Visit: Payer: 59 | Admitting: Nurse Practitioner

## 2020-04-27 ENCOUNTER — Encounter: Payer: Self-pay | Admitting: Nurse Practitioner

## 2020-04-27 VITALS — BP 112/74 | HR 92 | Temp 97.9°F | Ht 63.0 in | Wt 172.0 lb

## 2020-04-27 DIAGNOSIS — Z716 Tobacco abuse counseling: Secondary | ICD-10-CM | POA: Diagnosis not present

## 2020-04-27 DIAGNOSIS — Z3042 Encounter for surveillance of injectable contraceptive: Secondary | ICD-10-CM | POA: Diagnosis not present

## 2020-04-27 MED ORDER — MEDROXYPROGESTERONE ACETATE 150 MG/ML IM SUSP
150.0000 mg | Freq: Once | INTRAMUSCULAR | Status: AC
  Administered 2020-04-27: 150 mg via INTRAMUSCULAR

## 2020-04-27 MED ORDER — VARENICLINE TARTRATE 0.5 MG PO TABS: ORAL_TABLET | ORAL | 0 refills | Status: AC

## 2020-04-27 NOTE — Progress Notes (Signed)
Subjective:  Patient ID: Lori Hughes, female    DOB: [birth year]  Age: 45 y.o. MRN: [id number]  CC: Acute Visit (Pt would like to discuss birth control options. Pt was on depo but states she gained weight and would like to know what other options she has. )  Nicotine Dependence Presents for initial visit. Symptoms include cravings, headache and irritability. Preferred tobacco types include cigarettes. Preferred cigarette types include filtered. Preferred strength is regular. Preferred cigarettes are menthol. Her urge triggers include company of smokers, contact with substance and stress. Risk factors do not include decrease in perceived risk.Her first smoke is from 6 to 8 AM. She smokes 2 packs of cigarettes per day. She started smoking when she was >28 years old. Past treatments include nothing. Lori Hughes is ready to quit. Lori Hughes has tried to quit 0 times. There is no history of alcohol abuse and drug use.   not sexually active fore last 1year, last depo injection 12/30/2019  Reviewed past Medical, Social and Family history today.  Outpatient Medications Prior to Visit  Medication Sig Dispense Refill  . FLUoxetine (PROZAC) 10 MG tablet Take 10 mg by mouth daily.    . mirtazapine (REMERON) 30 MG tablet Take 30 mg by mouth at bedtime.    . medroxyPROGESTERone (DEPO-PROVERA) 150 MG/ML injection Inject 1 mL (150 mg total) into the muscle once for 1 dose. 1 mL 0  . ferrous sulfate 324 (65 Fe) MG TBEC Take 1 tablet (325 mg total) by mouth 3 (three) times daily after meals. (Patient not taking: Reported on 04/27/2020) 90 tablet 5   No facility-administered medications prior to visit.    ROS See HPI  Objective:  BP 112/74 (BP Location: Left Arm, Patient Position: Sitting, Cuff Size: Normal)   Pulse 92   Temp 97.9 F (36.6 C) (Temporal)   Ht 5\' 3"  (1.6 m)   Wt 172 lb (78 kg)   SpO2 97%   BMI 30.47 kg/m   Physical Exam Vitals reviewed.  Constitutional:      Appearance: She is  obese.  Cardiovascular:     Rate and Rhythm: Normal rate.     Pulses: Normal pulses.  Neurological:     Mental Status: She is alert and oriented to person, place, and time.  Psychiatric:        Mood and Affect: Mood normal.        Behavior: Behavior normal.        Thought Content: Thought content normal.    Assessment & Plan:  This visit occurred during the SARS-CoV-2 public health emergency.  Safety protocols were in place, including screening questions prior to the visit, additional usage of staff PPE, and extensive cleaning of exam room while observing appropriate contact time as indicated for disinfecting solutions.   Lori Hughes was seen today for acute visit.  Diagnoses and all orders for this visit:  Encounter for Depo-Provera contraception -     medroxyPROGESTERone (DEPO-PROVERA) injection 150 mg  Encounter for tobacco use cessation counseling -     varenicline (CHANTIX) 0.5 MG tablet; Take 1 tablet (0.5 mg total) by mouth 2 (two) times daily for 7 days, THEN 1 tablet (0.5 mg total) 2 (two) times daily for 7 days, THEN 2 tablets (1 mg total) 2 (two) times daily for 16 days.   Problem List Items Addressed This Visit   None   Visit Diagnoses    Encounter for Depo-Provera contraception    -  Primary   Relevant Medications  medroxyPROGESTERone (DEPO-PROVERA) injection 150 mg (Start on 04/27/2020  3:30 PM)   Encounter for tobacco use cessation counseling       Relevant Medications   varenicline (CHANTIX) 0.5 MG tablet      Follow-up: Return in about 4 weeks (around 05/25/2020) for CPE and tobacco cessation.  Wilfred Lacy, NP

## 2020-04-27 NOTE — Patient Instructions (Signed)
Varenicline oral tablets What is this medicine? VARENICLINE (var e NI kleen) is used to help people quit smoking. It is used with a patient support program recommended by your physician. This medicine may be used for other purposes; ask your health care provider or pharmacist if you have questions. COMMON BRAND NAME(S): Chantix What should I tell my health care provider before I take this medicine? They need to know if you have any of these conditions:  heart disease  if you often drink alcohol  kidney disease  mental illness  on hemodialysis  seizures  history of stroke  suicidal thoughts, plans, or attempt; a previous suicide attempt by you or a family member  an unusual or allergic reaction to varenicline, other medicines, foods, dyes, or preservatives  pregnant or trying to get pregnant  breast-feeding How should I use this medicine? Take this medicine by mouth after eating. Take with a full glass of water. Follow the directions on the prescription label. Take your doses at regular intervals. Do not take your medicine more often than directed. There are 3 ways you can use this medicine to help you quit smoking; talk to your health care professional to decide which plan is right for you: 1) you can choose a quit date and start this medicine 1 week before the quit date, or, 2) you can start taking this medicine before you choose a quit date, and then pick a quit date between day 8 and 35 days of treatment, or, 3) if you are not sure that you are able or willing to quit smoking right away, start taking this medicine and slowly decrease the amount you smoke as directed by your health care professional with the goal of being cigarette-free by week 12 of treatment. Stick to your plan; ask about support groups or other ways to help you remain cigarette-free. If you are motivated to quit smoking and did not succeed during a previous attempt with this medicine for reasons other than  side effects, or if you returned to smoking after this treatment, speak with your health care professional about whether another course of this medicine may be right for you. A special MedGuide will be given to you by the pharmacist with each prescription and refill. Be sure to read this information carefully each time. Talk to your pediatrician regarding the use of this medicine in children. This medicine is not approved for use in children. Overdosage: If you think you have taken too much of this medicine contact a poison control center or emergency room at once. NOTE: This medicine is only for you. Do not share this medicine with others. What if I miss a dose? If you miss a dose, take it as soon as you can. If it is almost time for your next dose, take only that dose. Do not take double or extra doses. What may interact with this medicine?  alcohol  insulin  other medicines used to help people quit smoking  theophylline  warfarin This list may not describe all possible interactions. Give your health care provider a list of all the medicines, herbs, non-prescription drugs, or dietary supplements you use. Also tell them if you smoke, drink alcohol, or use illegal drugs. Some items may interact with your medicine. What should I watch for while using this medicine? It is okay if you do not succeed at your attempt to quit and have a cigarette. You can still continue your quit attempt and keep using this medicine as directed.   Just throw away your cigarettes and get back to your quit plan. Talk to your health care provider before using other treatments to quit smoking. Using this medicine with other treatments to quit smoking may increase the risk for side effects compared to using a treatment alone. You may get drowsy or dizzy. Do not drive, use machinery, or do anything that needs mental alertness until you know how this medicine affects you. Do not stand or sit up quickly, especially if you are  an older patient. This reduces the risk of dizzy or fainting spells. Decrease the number of alcoholic beverages that you drink during treatment with this medicine until you know if this medicine affects your ability to tolerate alcohol. Some people have experienced increased drunkenness (intoxication), unusual or sometimes aggressive behavior, or no memory of things that have happened (amnesia) during treatment with this medicine. Sleepwalking can happen during treatment with this medicine, and can sometimes lead to behavior that is harmful to you, other people, or property. Stop taking this medicine and tell your doctor if you start sleepwalking or have other unusual sleep-related activity. After taking this medicine, you may get up out of bed and do an activity that you do not know you are doing. The next morning, you may have no memory of this. Activities include driving a car ("sleep-driving"), making and eating food, talking on the phone, sexual activity, and sleep-walking. Serious injuries have occurred. Stop the medicine and call your doctor right away if you find out you have done any of these activities. Do not take this medicine if you have used alcohol that evening. Do not take it if you have taken another medicine for sleep. The risk of doing these sleep-related activities is higher. Patients and their families should watch out for new or worsening depression or thoughts of suicide. Also watch out for sudden changes in feelings such as feeling anxious, agitated, panicky, irritable, hostile, aggressive, impulsive, severely restless, overly excited and hyperactive, or not being able to sleep. If this happens, call your health care professional. If you have diabetes and you quit smoking, the effects of insulin may be increased and you may need to reduce your insulin dose. Check with your doctor or health care professional about how you should adjust your insulin dose. What side effects may I notice  from receiving this medicine? Side effects that you should report to your doctor or health care professional as soon as possible:  allergic reactions like skin rash, itching or hives, swelling of the face, lips, tongue, or throat  acting aggressive, being angry or violent, or acting on dangerous impulses  breathing problems  changes in emotions or moods  chest pain or chest tightness  feeling faint or lightheaded, falls  hallucination, loss of contact with reality  mouth sores  redness, blistering, peeling or loosening of the skin, including inside the mouth  signs and symptoms of a stroke like changes in vision; confusion; trouble speaking or understanding; severe headaches; sudden numbness or weakness of the face, arm or leg; trouble walking; dizziness; loss of balance or coordination  seizures  sleepwalking  suicidal thoughts or other mood changes Side effects that usually do not require medical attention (report to your doctor or health care professional if they continue or are bothersome):  constipation  gas  headache  nausea, vomiting  strange dreams  trouble sleeping This list may not describe all possible side effects. Call your doctor for medical advice about side effects. You may report side  effects to FDA at 1-800-FDA-1088. Where should I keep my medicine? Keep out of the reach of children. Store at room temperature between 15 and 30 degrees C (59 and 86 degrees F). Throw away any unused medicine after the expiration date. NOTE: This sheet is a summary. It may not cover all possible information. If you have questions about this medicine, talk to your doctor, pharmacist, or health care provider.  2021 Elsevier/Gold Standard (2018-01-16 14:27:36)

## 2020-06-19 ENCOUNTER — Encounter: Payer: 59 | Admitting: Nurse Practitioner

## 2020-07-06 ENCOUNTER — Encounter: Payer: 59 | Admitting: Nurse Practitioner

## 2020-07-17 ENCOUNTER — Encounter: Payer: Self-pay | Admitting: Nurse Practitioner

## 2020-07-20 ENCOUNTER — Other Ambulatory Visit: Payer: Self-pay

## 2020-07-20 ENCOUNTER — Ambulatory Visit (INDEPENDENT_AMBULATORY_CARE_PROVIDER_SITE_OTHER): Payer: 59

## 2020-07-20 DIAGNOSIS — Z3042 Encounter for surveillance of injectable contraceptive: Secondary | ICD-10-CM | POA: Diagnosis not present

## 2020-07-20 MED ORDER — MEDROXYPROGESTERONE ACETATE 150 MG/ML IM SUSP
150.0000 mg | Freq: Once | INTRAMUSCULAR | Status: AC
  Administered 2020-07-20: 15:00:00 150 mg via INTRAMUSCULAR

## 2020-07-20 NOTE — Progress Notes (Signed)
..  After obtaining consent, and per orders of [facility] injection of Depo provera given by Lynda Rainwater in left deltoid. Patient instructed to remain in clinic for 20 minutes afterwards, and to report any adverse reaction to me immediately.

## 2020-07-20 NOTE — Progress Notes (Deleted)
..  After obtaining consent, and per orders of [facility], injection of Depo given by Lynda Rainwater. Patient instructed to remain in clinic for 20 minutes afterwards, and to report any adverse reaction to me immediately.

## 2020-08-26 DIAGNOSIS — E785 Hyperlipidemia, unspecified: Secondary | ICD-10-CM | POA: Insufficient documentation

## 2020-09-27 ENCOUNTER — Encounter: Payer: 59 | Admitting: Nurse Practitioner

## 2020-10-05 ENCOUNTER — Encounter: Payer: Self-pay | Admitting: Nurse Practitioner

## 2020-10-05 ENCOUNTER — Ambulatory Visit (INDEPENDENT_AMBULATORY_CARE_PROVIDER_SITE_OTHER): Payer: 59 | Admitting: Nurse Practitioner

## 2020-10-05 ENCOUNTER — Other Ambulatory Visit: Payer: Self-pay

## 2020-10-05 VITALS — BP 110/80 | HR 63 | Temp 98.0°F | Ht 63.0 in | Wt 162.2 lb

## 2020-10-05 DIAGNOSIS — Z0000 Encounter for general adult medical examination without abnormal findings: Secondary | ICD-10-CM

## 2020-10-05 DIAGNOSIS — Z3042 Encounter for surveillance of injectable contraceptive: Secondary | ICD-10-CM | POA: Diagnosis not present

## 2020-10-05 DIAGNOSIS — D500 Iron deficiency anemia secondary to blood loss (chronic): Secondary | ICD-10-CM | POA: Diagnosis not present

## 2020-10-05 DIAGNOSIS — F419 Anxiety disorder, unspecified: Secondary | ICD-10-CM

## 2020-10-05 DIAGNOSIS — Z13220 Encounter for screening for lipoid disorders: Secondary | ICD-10-CM

## 2020-10-05 DIAGNOSIS — Z136 Encounter for screening for cardiovascular disorders: Secondary | ICD-10-CM | POA: Diagnosis not present

## 2020-10-05 DIAGNOSIS — Z1231 Encounter for screening mammogram for malignant neoplasm of breast: Secondary | ICD-10-CM

## 2020-10-05 MED ORDER — MEDROXYPROGESTERONE ACETATE 150 MG/ML IM SUSP
150.0000 mg | Freq: Once | INTRAMUSCULAR | Status: AC
  Administered 2020-10-05: 150 mg via INTRAMUSCULAR

## 2020-10-05 NOTE — Patient Instructions (Addendum)
Go to lab for blood day  You will be contacted to schedule appt for mammogram.  Schedule appt for depot injection in 90days  Preventive Care 70-43 Years Old, Female Preventive care refers to lifestyle choices and visits with your health care provider that can promote health and wellness. This includes: A yearly physical exam. This is also called an annual wellness visit. Regular dental and eye exams. Immunizations. Screening for certain conditions. Healthy lifestyle choices, such as: Eating a healthy diet. Getting regular exercise. Not using drugs or products that contain nicotine and tobacco. Limiting alcohol use. What can I expect for my preventive care visit? Physical exam Your health care provider will check your: Height and weight. These may be used to calculate your BMI (body mass index). BMI is a measurement that tells if you are at a healthy weight. Heart rate and blood pressure. Body temperature. Skin for abnormal spots. Counseling Your health care provider may ask you questions about your: Past medical problems. Family's medical history. Alcohol, tobacco, and drug use. Emotional well-being. Home life and relationship well-being. Sexual activity. Diet, exercise, and sleep habits. Work and work Statistician. Access to firearms. Method of birth control. Menstrual cycle. Pregnancy history. What immunizations do I need?  Vaccines are usually given at various ages, according to a schedule. Your health care provider will recommend vaccines for you based on your age, medicalhistory, and lifestyle or other factors, such as travel or where you work. What tests do I need? Blood tests Lipid and cholesterol levels. These may be checked every 5 years, or more often if you are over 37 years old. Hepatitis C test. Hepatitis B test. Screening Lung cancer screening. You may have this screening every year starting at age 37 if you have a 30-pack-year history of smoking and  currently smoke or have quit within the past 15 years. Colorectal cancer screening. All adults should have this screening starting at age 37 and continuing until age 37. Your health care provider may recommend screening at age 3 if you are at increased risk. You will have tests every 1-10 years, depending on your results and the type of screening test. Diabetes screening. This is done by checking your blood sugar (glucose) after you have not eaten for a while (fasting). You may have this done every 1-3 years. Mammogram. This may be done every 1-2 years. Talk with your health care provider about when you should start having regular mammograms. This may depend on whether you have a family history of breast cancer. BRCA-related cancer screening. This may be done if you have a family history of breast, ovarian, tubal, or peritoneal cancers. Pelvic exam and Pap test. This may be done every 3 years starting at age 100. Starting at age 50, this may be done every 5 years if you have a Pap test in combination with an HPV test. Other tests STD (sexually transmitted disease) testing, if you are at risk. Bone density scan. This is done to screen for osteoporosis. You may have this scan if you are at high risk for osteoporosis. Talk with your health care provider about your test results, treatment options,and if necessary, the need for more tests. Follow these instructions at home: Eating and drinking  Eat a diet that includes fresh fruits and vegetables, whole grains, lean protein, and low-fat dairy products. Take vitamin and mineral supplements as recommended by your health care provider. Do not drink alcohol if: Your health care provider tells you not to drink. You are pregnant,  may be pregnant, or are planning to become pregnant. If you drink alcohol: Limit how much you have to 0-1 drink a day. Be aware of how much alcohol is in your drink. In the [place] one drink equals one 12 oz bottle of beer  (355 mL), one 5 oz glass of wine (148 mL), or one 1 oz glass of hard liquor (44 mL).  Lifestyle Take daily care of your teeth and gums. Brush your teeth every morning and night with fluoride toothpaste. Floss one time each day. Stay active. Exercise for at least 30 minutes 5 or more days each week. Do not use any products that contain nicotine or tobacco, such as cigarettes, e-cigarettes, and chewing tobacco. If you need help quitting, ask your health care provider. Do not use drugs. If you are sexually active, practice safe sex. Use a condom or other form of protection to prevent STIs (sexually transmitted infections). If you do not wish to become pregnant, use a form of birth control. If you plan to become pregnant, see your health care provider for a prepregnancy visit. If told by your health care provider, take low-dose aspirin daily starting at age 28. Find healthy ways to cope with stress, such as: Meditation, yoga, or listening to music. Journaling. Talking to a trusted person. Spending time with friends and family. Safety Always wear your seat belt while driving or riding in a vehicle. Do not drive: If you have been drinking alcohol. Do not ride with someone who has been drinking. When you are tired or distracted. While texting. Wear a helmet and other protective equipment during sports activities. If you have firearms in your house, make sure you follow all gun safety procedures. What's next? Visit your health care provider once a year for an annual wellness visit. Ask your health care provider how often you should have your eyes and teeth checked. Stay up to date on all vaccines. This information is not intended to replace advice given to you by your health care provider. Make sure you discuss any questions you have with your healthcare provider. Document Revised: 11/02/2019 Document Reviewed: 10/09/2017 Elsevier Patient Education  2022 [employer].

## 2020-10-05 NOTE — Assessment & Plan Note (Signed)
Improved Denies need for remeron and prozac at this time. Use of melatonin prn for sleep

## 2020-10-05 NOTE — Assessment & Plan Note (Signed)
Unable to tolerate oral iron due to constipation. Amenorrhea with depoprovera injection. No melena or hematochezia Repeat cbc and iron panel

## 2020-10-05 NOTE — Progress Notes (Signed)
Subjective:    Patient ID: Lori Hughes, female    DOB: [birth year], 45 y.o.   MRN: [id number]  Patient presents today for CPE and eval of chronic conditions   HPI Anxiety Improved Denies need for remeron and prozac at this time. Use of melatonin prn for sleep  Vision:up to date Dental:will schedule Diet:regular Exercise:walking Weight:  Wt Readings from Last 3 Encounters:  10/05/20 162 lb 3.2 oz (73.6 kg)  04/27/20 172 lb (78 kg)  10/04/19 160 lb (72.6 kg)    Sexual History (orientation,birth control, marital status, STD):up to date with PAP, needs mammogram, denies need for STD screen today, amenorrhea with depoprovera injection, last dose 07/20/2020, due to next injection today  Depression/Suicide: Depression screen [facility] 2/9 10/05/2020 06/30/2019  Decreased Interest 0 0  Down, Depressed, Hopeless 0 0  PHQ - 2 Score 0 0   Immunizations: (TDAP, Hep C screen, Pneumovax, Influenza, zoster)  Health Maintenance  Topic Date Due   COVID-19 Vaccine (1) Never done   Flu Shot  09/11/2020   Pneumococcal Vaccination (1 - PCV) 10/05/2021*   Hepatitis C Screening: USPSTF Recommendation to screen - Ages 18-79 yo.  10/05/2021*   HIV Screening  10/05/2021*   Pap Smear  06/30/2022   Tetanus Vaccine  05/24/2030   HPV Vaccine  Aged Out  *Topic was postponed. The date shown is not the original due date.   Fall Risk: Fall Risk  06/30/2019  Falls in the past year? 0  Number falls in past yr: 0  Injury with Fall? 0   Medications and allergies reviewed with patient and updated if appropriate.  Patient Active Problem List   Diagnosis Date Noted   Hyperlipidemia 08/26/2020   Anxiety 02/14/2020   History of depression 02/14/2020   Sebaceous cyst 10/23/2018    Current Outpatient Medications on File Prior to Visit  Medication Sig Dispense Refill   medroxyPROGESTERone (DEPO-PROVERA) 150 MG/ML injection Inject 1 mL (150 mg total) into the muscle once for 1 dose. 1 mL 0   No  current facility-administered medications on file prior to visit.    Past Medical History:  Diagnosis Date   Fibroid     History reviewed. No pertinent surgical history.  Social History   Socioeconomic History   Marital status: Single    Spouse name: Not on file   Number of children: 0   Years of education: Not on file   Highest education level: Not on file  Occupational History   Not on file  Tobacco Use   Smoking status: Every Day    Packs/day: 1.50    Types: Cigarettes   Smokeless tobacco: Never  Vaping Use   Vaping Use: Never used  Substance and Sexual Activity   Alcohol use: Yes    Comment: Rare   Drug use: Yes    Types: Marijuana    Comment: Occas   Sexual activity: Not Currently    Comment: 1st intercourse 45 yo-More than 5 partners  Other Topics Concern   Not on file  Social History Narrative   Not on file   Social Determinants of Health   Financial Resource Strain: Not on file  Food Insecurity: Not on file  Transportation Needs: Not on file  Physical Activity: Not on file  Stress: Not on file  Social Connections: Not on file    Family History  Problem Relation Age of Onset   Hypertension Mother    Hypertension Father    Cancer Maternal Grandmother 75  colon   Cancer Paternal Grandfather        Prostate        Review of Systems  Constitutional:  Negative for fever, malaise/fatigue and weight loss.  HENT:  Negative for congestion and sore throat.   Eyes:        Negative for visual changes  Respiratory:  Negative for cough and shortness of breath.   Cardiovascular:  Negative for chest pain, palpitations and leg swelling.  Gastrointestinal:  Negative for blood in stool, constipation, diarrhea and heartburn.  Genitourinary:  Negative for dysuria, frequency and urgency.  Musculoskeletal:  Negative for falls, joint pain and myalgias.  Skin:  Negative for rash.  Neurological:  Negative for dizziness, sensory change and headaches.   Endo/Heme/Allergies:  Does not bruise/bleed easily.  Psychiatric/Behavioral:  Negative for depression, substance abuse and suicidal ideas. The patient is not nervous/anxious.    Objective:   Vitals:   10/05/20 1340  BP: 110/80  Pulse: 63  Temp: 98 F (36.7 C)  SpO2: 98%    Body mass index is 28.73 kg/m.   Physical Examination:  Physical Exam Vitals reviewed. Exam conducted with a chaperone present.  Constitutional:      General: She is not in acute distress.    Appearance: She is well-developed. She is obese.  HENT:     Right Ear: Tympanic membrane, ear canal and external ear normal.     Left Ear: Tympanic membrane, ear canal and external ear normal.  Eyes:     Extraocular Movements: Extraocular movements intact.     Conjunctiva/sclera: Conjunctivae normal.  Cardiovascular:     Rate and Rhythm: Normal rate and regular rhythm.     Pulses: Normal pulses.     Heart sounds: Normal heart sounds.  Pulmonary:     Effort: Pulmonary effort is normal. No respiratory distress.     Breath sounds: Normal breath sounds.  Chest:     Chest wall: No tenderness.  Breasts:    Right: Normal.     Left: Normal.  Abdominal:     General: Bowel sounds are normal.     Palpations: Abdomen is soft.  Genitourinary:    Comments: declined Musculoskeletal:        General: Normal range of motion.     Cervical back: Normal range of motion and neck supple.  Lymphadenopathy:     Cervical: No cervical adenopathy.     Upper Body:     Right upper body: No supraclavicular, axillary or pectoral adenopathy.     Left upper body: No supraclavicular, axillary or pectoral adenopathy.  Neurological:     Mental Status: She is alert and oriented to person, place, and time.     Deep Tendon Reflexes: Reflexes are normal and symmetric.   ASSESSMENT and PLAN: This visit occurred during the SARS-CoV-2 public health emergency.  Safety protocols were in place, including screening questions prior to the visit,  additional usage of staff PPE, and extensive cleaning of exam room while observing appropriate contact time as indicated for disinfecting solutions.   Lori Hughes was seen today for annual exam.  Diagnoses and all orders for this visit:  Encounter for Depo-Provera contraception -     medroxyPROGESTERone (DEPO-PROVERA) injection 150 mg  Preventative health care -     MM 3D SCREEN BREAST BILATERAL; Future -     Comprehensive metabolic panel -     Lipid panel -     TSH  Encounter for lipid screening for cardiovascular disease -  Lipid panel  Breast cancer screening by mammogram -     MM 3D SCREEN BREAST BILATERAL; Future  Iron deficiency anemia due to chronic blood loss -     CBC with Differential/Platelet -     Iron, TIBC and Ferritin Panel  Anxiety     Problem List Items Addressed This Visit       Other   Anxiety    Improved Denies need for remeron and prozac at this time. Use of melatonin prn for sleep      Other Visit Diagnoses     Encounter for Depo-Provera contraception    -  Primary   Relevant Medications   medroxyPROGESTERone (DEPO-PROVERA) injection 150 mg (Completed) (Start on 10/05/2020  3:00 PM)   Preventative health care       Relevant Orders   MM 3D SCREEN BREAST BILATERAL   Comprehensive metabolic panel   Lipid panel   TSH   Encounter for lipid screening for cardiovascular disease       Relevant Orders   Lipid panel   Breast cancer screening by mammogram       Relevant Orders   MM 3D SCREEN BREAST BILATERAL   Iron deficiency anemia due to chronic blood loss       Relevant Orders   CBC with Differential/Platelet   Iron, TIBC and Ferritin Panel       Follow up: Return in about 1 year (around 10/05/2021) for CPE (fasting).  Wilfred Lacy, NP

## 2020-10-06 LAB — CBC WITH DIFFERENTIAL/PLATELET
Basophils Absolute: 0.1 10*3/uL (ref 0.0–0.1)
Basophils Relative: 1.3 % (ref 0.0–3.0)
Eosinophils Absolute: 0.3 10*3/uL (ref 0.0–0.7)
Eosinophils Relative: 5.6 % — ABNORMAL HIGH (ref 0.0–5.0)
HCT: 42.5 % (ref 36.0–46.0)
Hemoglobin: 14.2 g/dL (ref 12.0–15.0)
Lymphocytes Relative: 34.1 % (ref 12.0–46.0)
Lymphs Abs: 1.9 10*3/uL (ref 0.7–4.0)
MCHC: 33.5 g/dL (ref 30.0–36.0)
MCV: 102.5 fl — ABNORMAL HIGH (ref 78.0–100.0)
Monocytes Absolute: 0.6 10*3/uL (ref 0.1–1.0)
Monocytes Relative: 10.2 % (ref 3.0–12.0)
Neutro Abs: 2.7 10*3/uL (ref 1.4–7.7)
Neutrophils Relative %: 48.8 % (ref 43.0–77.0)
Platelets: 253 10*3/uL (ref 150.0–400.0)
RBC: 4.15 Mil/uL (ref 3.87–5.11)
RDW: 13.5 % (ref 11.5–15.5)
WBC: 5.5 10*3/uL (ref 4.0–10.5)

## 2020-10-06 LAB — LIPID PANEL
Cholesterol: 229 mg/dL — ABNORMAL HIGH (ref 0–200)
HDL: 49 mg/dL (ref >39.00)
LDL Cholesterol: 162 mg/dL — ABNORMAL HIGH (ref 0–99)
NonHDL: 180.35
Total CHOL/HDL Ratio: 5
Triglycerides: 94 mg/dL (ref 0.0–149.0)
VLDL: 18.8 mg/dL (ref 0.0–40.0)

## 2020-10-06 LAB — IRON,TIBC AND FERRITIN PANEL
%SAT: 15 % (calc) — ABNORMAL LOW (ref 16–45)
Ferritin: 51 ng/mL (ref 16–232)
Iron: 49 ug/dL (ref 40–190)
TIBC: 323 mcg/dL (calc) (ref 250–450)

## 2020-10-06 LAB — COMPREHENSIVE METABOLIC PANEL
ALT: 15 U/L (ref 0–35)
AST: 14 U/L (ref 0–37)
Albumin: 4.1 g/dL (ref 3.5–5.2)
Alkaline Phosphatase: 88 U/L (ref 39–117)
BUN: 10 mg/dL (ref 6–23)
CO2: 21 mEq/L (ref 19–32)
Calcium: 9.8 mg/dL (ref 8.4–10.5)
Chloride: 108 mEq/L (ref 96–112)
Creatinine, Ser: 0.89 mg/dL (ref 0.40–1.20)
GFR: 79.63 mL/min (ref >60.00)
Glucose, Bld: 82 mg/dL (ref 70–99)
Potassium: 4.3 mEq/L (ref 3.5–5.1)
Sodium: 140 mEq/L (ref 135–145)
Total Bilirubin: 0.4 mg/dL (ref 0.2–1.2)
Total Protein: 7.6 g/dL (ref 6.0–8.3)

## 2020-10-06 LAB — TSH: TSH: 1.69 u[IU]/mL (ref 0.35–5.50)

## 2020-10-12 ENCOUNTER — Ambulatory Visit: Payer: 59

## 2020-12-04 ENCOUNTER — Ambulatory Visit
Admission: RE | Admit: 2020-12-04 | Discharge: 2020-12-04 | Disposition: A | Discharge status: Home / Self Care | Payer: 59 | Source: Ambulatory Visit | Attending: Nurse Practitioner | Admitting: Nurse Practitioner

## 2020-12-04 ENCOUNTER — Other Ambulatory Visit: Payer: Self-pay

## 2020-12-04 DIAGNOSIS — Z0000 Encounter for general adult medical examination without abnormal findings: Secondary | ICD-10-CM

## 2020-12-04 DIAGNOSIS — Z1231 Encounter for screening mammogram for malignant neoplasm of breast: Secondary | ICD-10-CM

## 2020-12-08 ENCOUNTER — Other Ambulatory Visit: Payer: Self-pay | Admitting: Nurse Practitioner

## 2020-12-08 DIAGNOSIS — R928 Other abnormal and inconclusive findings on diagnostic imaging of breast: Secondary | ICD-10-CM

## 2020-12-21 ENCOUNTER — Other Ambulatory Visit: Payer: Self-pay

## 2020-12-21 ENCOUNTER — Ambulatory Visit (INDEPENDENT_AMBULATORY_CARE_PROVIDER_SITE_OTHER): Payer: 59

## 2020-12-21 DIAGNOSIS — Z3042 Encounter for surveillance of injectable contraceptive: Secondary | ICD-10-CM | POA: Diagnosis not present

## 2020-12-21 MED ORDER — MEDROXYPROGESTERONE ACETATE 150 MG/ML IM SUSP
150.0000 mg | Freq: Once | INTRAMUSCULAR | Status: AC
  Administered 2020-12-21: 150 mg via INTRAMUSCULAR

## 2020-12-21 NOTE — Progress Notes (Signed)
Per orders of Wilfred Lacy, NP, injection of Depo given by Armandina Gemma, cma .  Patient tolerated injection well. Scheduled for next one on 03/08/2021 at 2:00 pm.  Dm/cma

## 2021-01-01 ENCOUNTER — Ambulatory Visit
Admission: RE | Admit: 2021-01-01 | Discharge: 2021-01-01 | Disposition: A | Discharge status: Home / Self Care | Payer: 59 | Source: Ambulatory Visit | Attending: Nurse Practitioner | Admitting: Nurse Practitioner

## 2021-01-01 DIAGNOSIS — R928 Other abnormal and inconclusive findings on diagnostic imaging of breast: Secondary | ICD-10-CM

## 2021-03-08 ENCOUNTER — Ambulatory Visit: Payer: 59

## 2021-03-15 ENCOUNTER — Ambulatory Visit (INDEPENDENT_AMBULATORY_CARE_PROVIDER_SITE_OTHER): Payer: Managed Care, Other (non HMO)

## 2021-03-15 ENCOUNTER — Other Ambulatory Visit: Payer: Self-pay

## 2021-03-15 DIAGNOSIS — Z3042 Encounter for surveillance of injectable contraceptive: Secondary | ICD-10-CM

## 2021-03-15 MED ORDER — MEDROXYPROGESTERONE ACETATE 150 MG/ML IM SUSP
150.0000 mg | Freq: Once | INTRAMUSCULAR | Status: AC
  Administered 2021-03-15: 150 mg via INTRAMUSCULAR

## 2021-03-15 NOTE — Progress Notes (Signed)
Per orders of Wilfred Lacy, NP, injection of Medroxyprogesterone  150 mg given in LT deltoid by Armandina Gemma, cma.  Patient tolerated injection well.  Scheduled for next injection on 05/31/21 @ 2:00 pm.  Dm/cma

## 2021-05-31 ENCOUNTER — Ambulatory Visit (INDEPENDENT_AMBULATORY_CARE_PROVIDER_SITE_OTHER): Payer: Managed Care, Other (non HMO)

## 2021-05-31 DIAGNOSIS — Z3042 Encounter for surveillance of injectable contraceptive: Secondary | ICD-10-CM

## 2021-05-31 MED ORDER — MEDROXYPROGESTERONE ACETATE 150 MG/ML IM SUSP
150.0000 mg | Freq: Once | INTRAMUSCULAR | Status: AC
  Administered 2021-05-31: 150 mg via INTRAMUSCULAR

## 2021-05-31 NOTE — Progress Notes (Signed)
Per orders of [facility] , pt is here for Depo Provera injection. pt received Depo Provera Injection in Left Deltoid at 2:25 pm. Given by [place] L. CMA/CPT. Pt tolerated Depo Provera injection well. Pt will return between jul. 6th and Jul. 20th for next injection.  ? ?

## 2021-08-23 ENCOUNTER — Ambulatory Visit (INDEPENDENT_AMBULATORY_CARE_PROVIDER_SITE_OTHER): Payer: Commercial Managed Care - HMO

## 2021-08-23 DIAGNOSIS — Z3042 Encounter for surveillance of injectable contraceptive: Secondary | ICD-10-CM

## 2021-08-23 MED ORDER — MEDROXYPROGESTERONE ACETATE 150 MG/ML IM SUSP
150.0000 mg | Freq: Once | INTRAMUSCULAR | Status: AC
  Administered 2021-08-23: 150 mg via INTRAMUSCULAR

## 2021-08-23 NOTE — Progress Notes (Signed)
Per orders of Lori Lacy NP pt is here for Depo Provera Injection. PT received Depo Provera Injection in Right Deltoid at 2:20. Given by [place] L. CMA/CPT. Pt tolerated Depo Provera Injection well.  Pt will return in 3 mo's between Sept. 28th and Oct. 12th for next Depo Provera injection   Pregnancy test needed: no pt returned within time frame.

## 2021-11-08 ENCOUNTER — Ambulatory Visit (INDEPENDENT_AMBULATORY_CARE_PROVIDER_SITE_OTHER): Payer: Commercial Managed Care - HMO

## 2021-11-08 DIAGNOSIS — Z3042 Encounter for surveillance of injectable contraceptive: Secondary | ICD-10-CM

## 2021-11-08 MED ORDER — MEDROXYPROGESTERONE ACETATE 150 MG/ML IM SUSP
150.0000 mg | Freq: Once | INTRAMUSCULAR | Status: AC
  Administered 2021-11-08: 150 mg via INTRAMUSCULAR

## 2021-11-08 NOTE — Progress Notes (Signed)
After obtaining consent, and per orders of [facility], injection of MedroxyProgesterone Acetate given by Augustina Mood. Patient tolerated injection well and to report any adverse reaction to me immediately.

## 2022-02-07 ENCOUNTER — Ambulatory Visit: Payer: Commercial Managed Care - HMO

## 2022-02-13 ENCOUNTER — Ambulatory Visit (INDEPENDENT_AMBULATORY_CARE_PROVIDER_SITE_OTHER): Payer: Commercial Managed Care - HMO

## 2022-02-13 ENCOUNTER — Other Ambulatory Visit: Payer: Self-pay | Admitting: Nurse Practitioner

## 2022-02-13 DIAGNOSIS — Z1231 Encounter for screening mammogram for malignant neoplasm of breast: Secondary | ICD-10-CM

## 2022-02-13 DIAGNOSIS — Z3042 Encounter for surveillance of injectable contraceptive: Secondary | ICD-10-CM | POA: Diagnosis not present

## 2022-02-13 LAB — POCT URINE PREGNANCY: Preg Test, Ur: NEGATIVE

## 2022-02-13 MED ORDER — MEDROXYPROGESTERONE ACETATE 150 MG/ML IM SUSP
150.0000 mg | Freq: Once | INTRAMUSCULAR | Status: AC
  Administered 2022-02-13: 150 mg via INTRAMUSCULAR

## 2022-02-13 NOTE — Progress Notes (Signed)
After obtaining consent, and per orders of [facility], and negative pregnancy test obtained, injection of Medroxyprogesterone (Depo)'150mg'$ /ml given IM in the right deltoid by Alinda Dooms [place]. Patient instructed to remain in clinic for 20 minutes afterwards, and to report any adverse reaction to me immediately.

## 2022-04-03 ENCOUNTER — Ambulatory Visit
Admission: RE | Admit: 2022-04-03 | Discharge: 2022-04-03 | Disposition: A | Discharge status: Home / Self Care | Payer: Commercial Managed Care - HMO | Source: Ambulatory Visit | Attending: Nurse Practitioner | Admitting: Nurse Practitioner

## 2022-04-03 DIAGNOSIS — Z1231 Encounter for screening mammogram for malignant neoplasm of breast: Secondary | ICD-10-CM

## 2022-05-15 ENCOUNTER — Ambulatory Visit: Payer: Commercial Managed Care - HMO

## 2022-05-23 ENCOUNTER — Ambulatory Visit: Payer: 59

## 2022-05-23 NOTE — Progress Notes (Deleted)
Per orders of Alysia Penna pt is here for Depo Provera Injection. PT received Depo Provera Injection in *** at ***. Given by ***. Pt tolerated Depo Provera Injection well.  Pt will return in 3 mo's between August 08, 2022 and September 05, 2022 for next Depo Provera injection

## 2022-05-24 ENCOUNTER — Ambulatory Visit: Payer: 59

## 2022-05-24 DIAGNOSIS — Z30013 Encounter for initial prescription of injectable contraceptive: Secondary | ICD-10-CM

## 2022-05-24 DIAGNOSIS — N920 Excessive and frequent menstruation with regular cycle: Secondary | ICD-10-CM

## 2022-05-27 ENCOUNTER — Telehealth: Payer: Self-pay

## 2022-05-27 ENCOUNTER — Ambulatory Visit (INDEPENDENT_AMBULATORY_CARE_PROVIDER_SITE_OTHER): Payer: 59

## 2022-05-27 DIAGNOSIS — Z3042 Encounter for surveillance of injectable contraceptive: Secondary | ICD-10-CM

## 2022-05-27 MED ORDER — MEDROXYPROGESTERONE ACETATE 150 MG/ML IM SUSP
150.0000 mg | INTRAMUSCULAR | Status: AC
  Administered 2022-05-27 – 2023-02-18 (×4): 150 mg via INTRAMUSCULAR

## 2022-05-27 NOTE — Telephone Encounter (Signed)
After obtaining consent, and per orders of [place]  injection of Depo-provera  given by Olga Coaster. Patient instructed to remain in clinic for 20 minutes afterwards, and to report any adverse reaction to me immediately.

## 2022-06-03 ENCOUNTER — Telehealth: Payer: Self-pay

## 2022-06-03 NOTE — Telephone Encounter (Signed)
After obtaining consent, and per orders of Dr.Charlotte Nche,NP injection of Depo-Provera  given by Olga Coaster. Patient instructed to remain in clinic for 20 minutes afterwards, and to report any adverse reaction to me immediately.

## 2022-06-18 NOTE — Progress Notes (Addendum)
After obtaining consent, and per orders of Dr.Charlotte Nche,NP injection of Depo-Provera 150mg given by Satonya Lux. Patient instructed to remain in clinic for 20 minutes afterwards, and to report any adverse reaction to me immediately.  

## 2022-08-27 ENCOUNTER — Ambulatory Visit: Payer: 59

## 2022-08-28 ENCOUNTER — Ambulatory Visit (INDEPENDENT_AMBULATORY_CARE_PROVIDER_SITE_OTHER): Payer: 59

## 2022-08-28 DIAGNOSIS — Z3042 Encounter for surveillance of injectable contraceptive: Secondary | ICD-10-CM

## 2022-08-28 NOTE — Progress Notes (Signed)
Patient presents for Depo injection. Injection placed in Right deltoid region. Patient tolerated procedure well with no concerns.

## 2022-09-02 NOTE — Progress Notes (Signed)
Medical screening examination/treatment/procedure was performed by the CMA. As primary care provider I was immediately available for consulation/collaboration. I agree with above documentation. Alysia Penna, AGNP-C

## 2022-11-07 ENCOUNTER — Ambulatory Visit: Payer: 59

## 2022-11-07 ENCOUNTER — Telehealth: Payer: Self-pay

## 2022-11-07 NOTE — Telephone Encounter (Signed)
While reviewing the nurses schedule I noticed that patient scheduled her depo injection 1 week early. I consulted with Salvatore Decent, NP and was advised to have patient come in 1 week later. I contacted patient and advised her that her visit was scheduled 1 week early and we could schedule her for next week. She agreed. Rescheduled pt's NV from today to October 2nd at 3:40pm.

## 2022-11-13 ENCOUNTER — Ambulatory Visit: Payer: 59

## 2022-11-21 ENCOUNTER — Ambulatory Visit (INDEPENDENT_AMBULATORY_CARE_PROVIDER_SITE_OTHER): Payer: 59

## 2022-11-21 DIAGNOSIS — Z3042 Encounter for surveillance of injectable contraceptive: Secondary | ICD-10-CM | POA: Diagnosis not present

## 2022-11-21 NOTE — Progress Notes (Signed)
Per orders of Char[place]te Nche, FNP, injection of Depo given in LT deltoid by Mickle Plumb, cma.  Patient tolerated injection well.  She will return 02/18/23 for her next scheduled one. Dm/cma

## 2023-02-18 ENCOUNTER — Ambulatory Visit (INDEPENDENT_AMBULATORY_CARE_PROVIDER_SITE_OTHER): Payer: 59

## 2023-02-18 DIAGNOSIS — Z3042 Encounter for surveillance of injectable contraceptive: Secondary | ICD-10-CM | POA: Diagnosis not present

## 2023-02-18 NOTE — Progress Notes (Signed)
 Patient presents for depo injection per orders of Alysia Penna, NP. Injection placed in right deltoid region. Patient tolerated procedure well with no concerns. She will return on 05/06/2023 for her next scheduled injection.

## 2023-02-19 ENCOUNTER — Telehealth: Payer: Self-pay | Admitting: Nurse Practitioner

## 2023-02-19 DIAGNOSIS — D500 Iron deficiency anemia secondary to blood loss (chronic): Secondary | ICD-10-CM

## 2023-02-19 NOTE — Assessment & Plan Note (Signed)
 She can no continue depoprovera injections without an office visit with me.

## 2023-02-19 NOTE — Telephone Encounter (Signed)
 See note

## 2023-03-20 ENCOUNTER — Other Ambulatory Visit: Payer: Self-pay | Admitting: Nurse Practitioner

## 2023-03-20 DIAGNOSIS — Z1231 Encounter for screening mammogram for malignant neoplasm of breast: Secondary | ICD-10-CM

## 2023-04-03 ENCOUNTER — Ambulatory Visit: Payer: Commercial Managed Care - HMO | Admitting: Nurse Practitioner

## 2023-04-08 ENCOUNTER — Ambulatory Visit
Admission: RE | Admit: 2023-04-08 | Discharge: 2023-04-08 | Disposition: A | Discharge status: Home / Self Care | Payer: Commercial Managed Care - HMO | Source: Ambulatory Visit | Attending: Nurse Practitioner

## 2023-04-08 DIAGNOSIS — Z1231 Encounter for screening mammogram for malignant neoplasm of breast: Secondary | ICD-10-CM

## 2023-04-28 ENCOUNTER — Ambulatory Visit: Payer: Commercial Managed Care - HMO | Admitting: Nurse Practitioner

## 2023-04-28 ENCOUNTER — Encounter: Payer: Self-pay | Admitting: Nurse Practitioner

## 2023-04-28 VITALS — BP 136/74 | HR 72 | Temp 98.2°F | Ht 63.0 in | Wt 172.4 lb

## 2023-04-28 DIAGNOSIS — Z23 Encounter for immunization: Secondary | ICD-10-CM

## 2023-04-28 DIAGNOSIS — D500 Iron deficiency anemia secondary to blood loss (chronic): Secondary | ICD-10-CM

## 2023-04-28 DIAGNOSIS — E782 Mixed hyperlipidemia: Secondary | ICD-10-CM

## 2023-04-28 DIAGNOSIS — Z1211 Encounter for screening for malignant neoplasm of colon: Secondary | ICD-10-CM

## 2023-04-28 DIAGNOSIS — Z720 Tobacco use: Secondary | ICD-10-CM | POA: Insufficient documentation

## 2023-04-28 LAB — COMPREHENSIVE METABOLIC PANEL
ALT: 18 U/L (ref 0–35)
AST: 10 U/L (ref 0–37)
Albumin: 4.4 g/dL (ref 3.5–5.2)
Alkaline Phosphatase: 111 U/L (ref 39–117)
BUN: 15 mg/dL (ref 6–23)
CO2: 23 meq/L (ref 19–32)
Calcium: 9.9 mg/dL (ref 8.4–10.5)
Chloride: 106 meq/L (ref 96–112)
Creatinine, Ser: 0.86 mg/dL (ref 0.40–1.20)
GFR: 81.5 mL/min (ref >60.00)
Glucose, Bld: 84 mg/dL (ref 70–99)
Potassium: 5 meq/L (ref 3.5–5.1)
Sodium: 138 meq/L (ref 135–145)
Total Bilirubin: 0.3 mg/dL (ref 0.2–1.2)
Total Protein: 7.8 g/dL (ref 6.0–8.3)

## 2023-04-28 LAB — LIPID PANEL
Cholesterol: 229 mg/dL — ABNORMAL HIGH (ref 0–200)
HDL: 58.7 mg/dL (ref >39.00)
LDL Cholesterol: 144 mg/dL — ABNORMAL HIGH (ref 0–99)
NonHDL: 170.54
Total CHOL/HDL Ratio: 4
Triglycerides: 134 mg/dL (ref 0.0–149.0)
VLDL: 26.8 mg/dL (ref 0.0–40.0)

## 2023-04-28 LAB — CBC
HCT: 43.1 % (ref 36.0–46.0)
Hemoglobin: 14.4 g/dL (ref 12.0–15.0)
MCHC: 33.3 g/dL (ref 30.0–36.0)
MCV: 103.2 fl — ABNORMAL HIGH (ref 78.0–100.0)
Platelets: 294 10*3/uL (ref 150.0–400.0)
RBC: 4.17 Mil/uL (ref 3.87–5.11)
RDW: 14.3 % (ref 11.5–15.5)
WBC: 5.5 10*3/uL (ref 4.0–10.5)

## 2023-04-28 LAB — TSH: TSH: 1.69 u[IU]/mL (ref 0.35–5.50)

## 2023-04-28 NOTE — Assessment & Plan Note (Signed)
 Started at age 48, quit for 48yrs at age 25. Currently smokes 0.5ppd since age 50. She plans to use nicotine gum or patch to quit.

## 2023-04-28 NOTE — Assessment & Plan Note (Signed)
Repeat lipid panel Advised to quit tobacco use

## 2023-04-28 NOTE — Progress Notes (Signed)
                Established Patient Visit  Patient: Lori Hughes   DOB: 06-28-1975   48 y.o. Female  MRN: [id number] Visit Date: 04/28/2023  Subjective:    Chief Complaint  Patient presents with   Follow-up    Overdue follow up and depo injection    HPI Hyperlipidemia Repeat lipid panel Advised to quit tobacco use  Iron deficiency anemia due to chronic blood loss Occassional breakthrough bleeding x 1day, use of panty liner. No cramps. No clots Not sexually active, denies need for STD screen Lat depo Provera injection 02/18/2023, next injection scheduled for 05/06/2023. Repeat CBC, TSh and CMP   Tobacco use Started at age 3, quit for 69yrs at age 63. Currently smokes 0.5ppd since age 36. She plans to use nicotine gum or patch to quit. Reviewed medical, surgical, and social history today  Medications: No outpatient medications prior to visit.   Facility-Administered Medications Prior to Visit  Medication Dose Route Frequency Provider   medroxyPROGESTERone (DEPO-PROVERA) injection 150 mg  150 mg Intramuscular Q90 days Memorie Yokoyama, Bonna Gains, NP   Reviewed past medical and social history.   ROS per HPI above      Objective:  BP 136/74 (BP Location: Left Arm, Patient Position: Sitting, Cuff Size: Normal)   Pulse 72   Temp 98.2 F (36.8 C) (Temporal)   Ht 5\' 3"  (1.6 m)   Wt 172 lb 6.4 oz (78.2 kg)   SpO2 97%   BMI 30.54 kg/m      Physical Exam Vitals and nursing note reviewed.  Cardiovascular:     Rate and Rhythm: Normal rate and regular rhythm.     Pulses: Normal pulses.     Heart sounds: Normal heart sounds.  Pulmonary:     Effort: Pulmonary effort is normal.     Breath sounds: Normal breath sounds.  Neurological:     Mental Status: She is alert and oriented to person, place, and time.     No results found for any visits on 04/28/23.    Assessment & Plan:    Problem List Items Addressed This Visit     Hyperlipidemia   Repeat lipid  panel Advised to quit tobacco use      Relevant Orders   TSH   Lipid panel   Iron deficiency anemia due to chronic blood loss   Occassional breakthrough bleeding x 1day, use of panty liner. No cramps. No clots Not sexually active, denies need for STD screen Lat depo Provera injection 02/18/2023, next injection scheduled for 05/06/2023. Repeat CBC, TSh and CMP       Relevant Orders   CBC   Comprehensive metabolic panel   Tobacco use   Started at age 49, quit for 40yrs at age 24. Currently smokes 0.5ppd since age 64. She plans to use nicotine gum or patch to quit.      Other Visit Diagnoses       Immunization due    -  Primary   Relevant Orders   Flu vaccine trivalent PF, 6mos and older(Flulaval,Afluria,Fluarix,Fluzone) (Completed)   Pneumococcal conjugate vaccine 20-valent (Prevnar 20) (Completed)     Colon cancer screening       Relevant Orders   Ambulatory referral to Gastroenterology      Return in about 1 year (around 04/27/2024) for CPE (fasting).     Alysia Penna, NP

## 2023-04-28 NOTE — Assessment & Plan Note (Signed)
 Occassional breakthrough bleeding x 1day, use of panty liner. No cramps. No clots Not sexually active, denies need for STD screen Lat depo Provera injection 02/18/2023, next injection scheduled for 05/06/2023. Repeat CBC, TSh and CMP

## 2023-04-28 NOTE — Patient Instructions (Signed)
 Maintain nurse visit appointment for depo provera injections Go to lab

## 2023-05-02 ENCOUNTER — Encounter: Payer: Self-pay | Admitting: Nurse Practitioner

## 2023-05-06 ENCOUNTER — Ambulatory Visit (INDEPENDENT_AMBULATORY_CARE_PROVIDER_SITE_OTHER): Payer: 59

## 2023-05-06 DIAGNOSIS — Z3042 Encounter for surveillance of injectable contraceptive: Secondary | ICD-10-CM

## 2023-05-06 MED ORDER — MEDROXYPROGESTERONE ACETATE 150 MG/ML IM SUSP
150.0000 mg | Freq: Once | INTRAMUSCULAR | Status: AC
  Administered 2023-05-06: 150 mg via INTRAMUSCULAR

## 2023-05-06 NOTE — Progress Notes (Signed)
 Patient presents for a Depo-Provera injection. Injection placed in right deltoid region by [place] Smith,CMA. Patient tolerated procedure well with no concerns and is due for next injection between June 10-June 24th. A nurse visit was made for patient to have her next injection for June 24th at 3:20pm.

## 2023-07-14 ENCOUNTER — Encounter: Payer: Self-pay | Admitting: Nurse Practitioner

## 2023-08-05 ENCOUNTER — Ambulatory Visit (INDEPENDENT_AMBULATORY_CARE_PROVIDER_SITE_OTHER)

## 2023-08-05 VITALS — Ht 62.5 in | Wt 172.6 lb

## 2023-08-05 DIAGNOSIS — Z3042 Encounter for surveillance of injectable contraceptive: Secondary | ICD-10-CM | POA: Diagnosis not present

## 2023-08-05 MED ORDER — MEDROXYPROGESTERONE ACETATE 150 MG/ML IM SUSP
150.0000 mg | INTRAMUSCULAR | Status: AC
  Administered 2023-08-05 – 2023-10-29 (×2): 150 mg via INTRAMUSCULAR

## 2023-08-05 NOTE — Progress Notes (Signed)
 Per orders of Roselie Mood, NP, injection of Depo-Provera   given by Joeseph Piety in left deltoid. Patient tolerated injection well. Patient will make appointment for 3 month (Sept 09-Sept 23). Appointment schedule

## 2023-08-27 IMAGING — MG DIGITAL DIAGNOSTIC UNILAT LEFT W/ CAD
6 series · 6 of 6 positions shown · non-contrast
Comparison: Previous exam(s).

CLINICAL DATA: Screening recall for left breast calcifications.

EXAM:
DIGITAL DIAGNOSTIC UNILATERAL LEFT MAMMOGRAM WITH CAD
TECHNIQUE: Left digital diagnostic mammography was performed. Mammographic
images were processed with CAD.

[L CC]
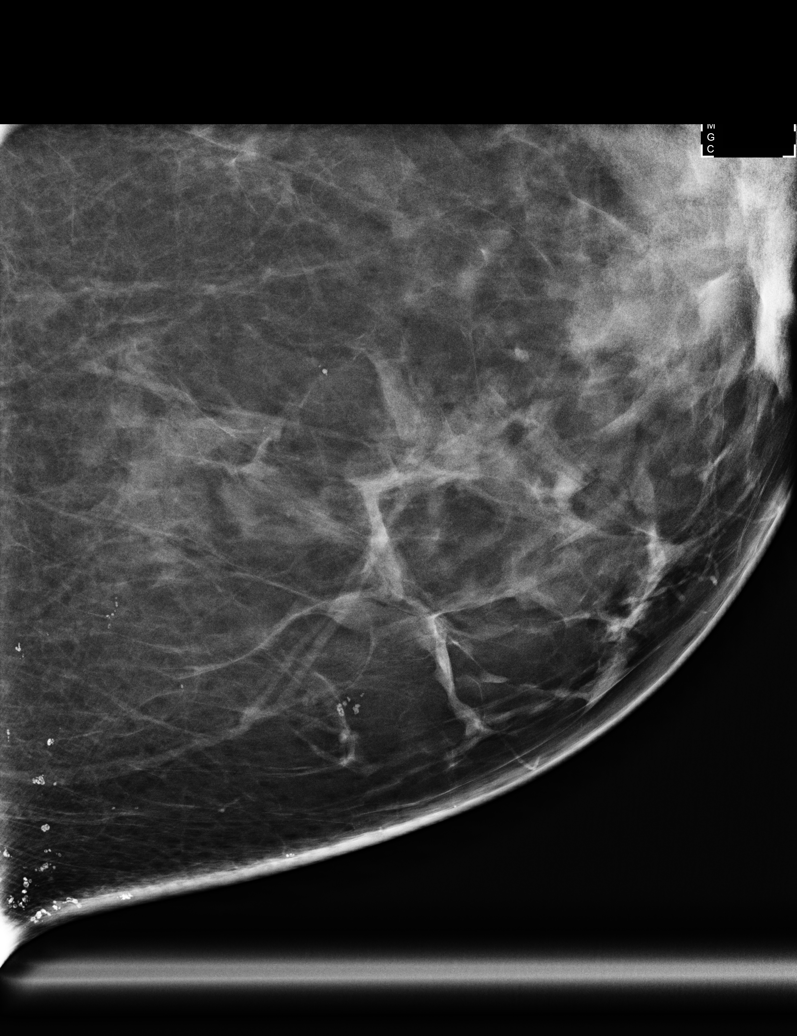

[L ML (1 of 4)]
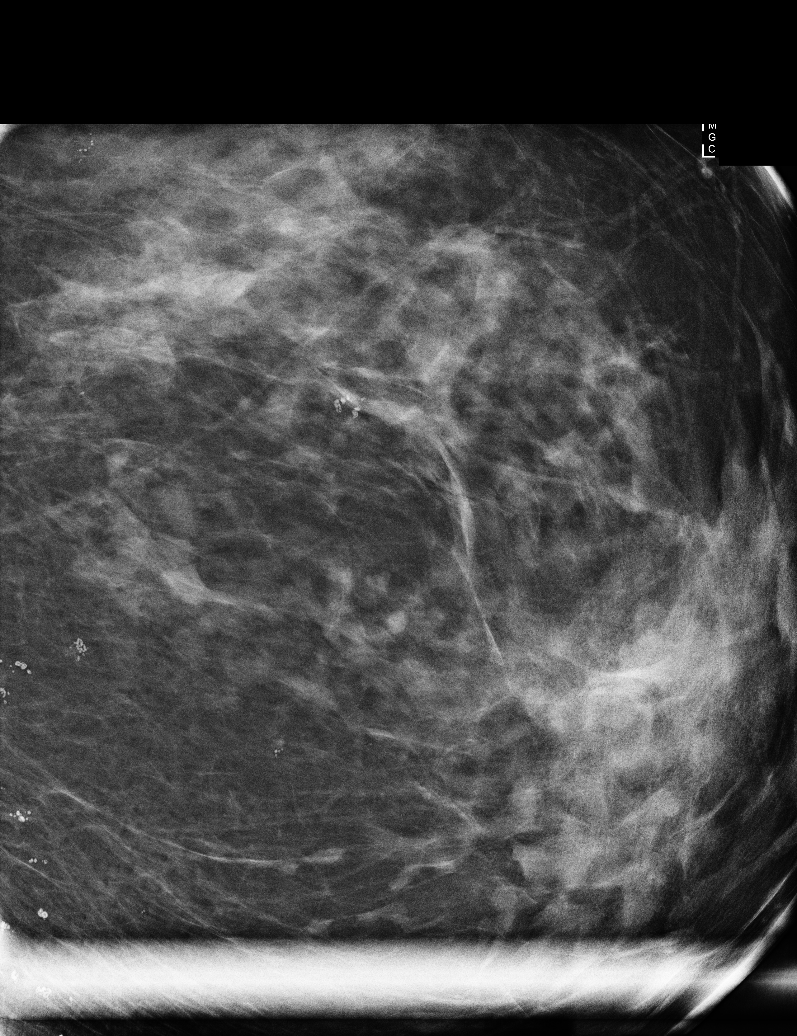

[L ML (2 of 4)]
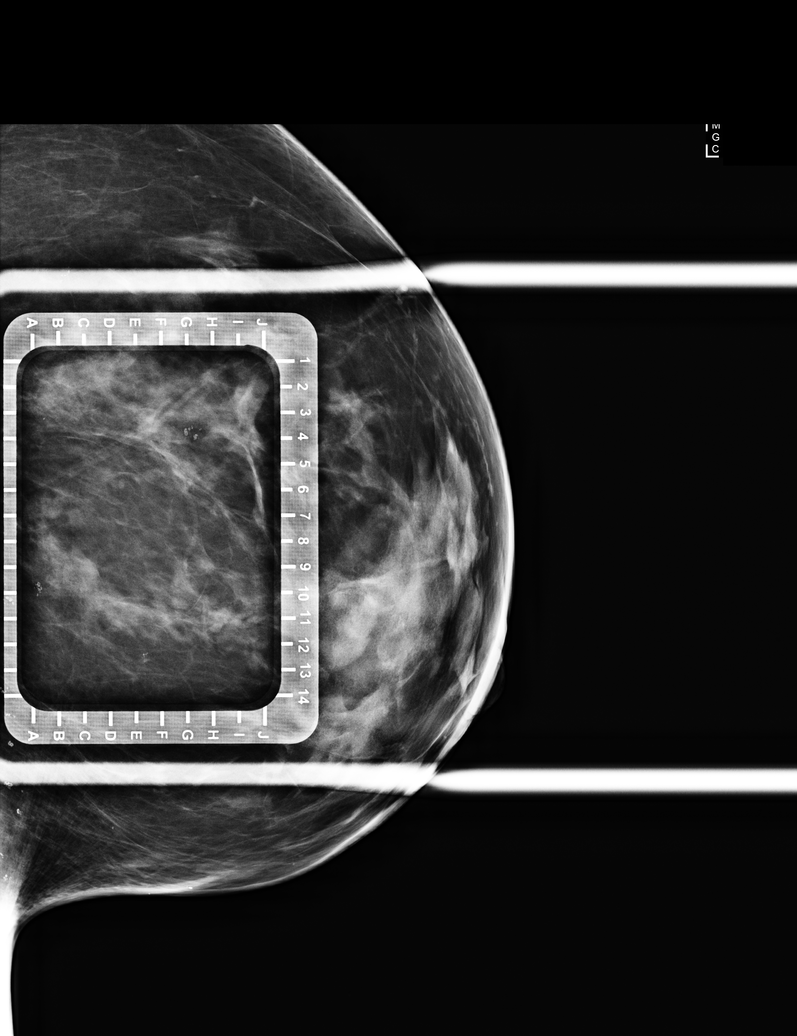

[L ML (3 of 4)]
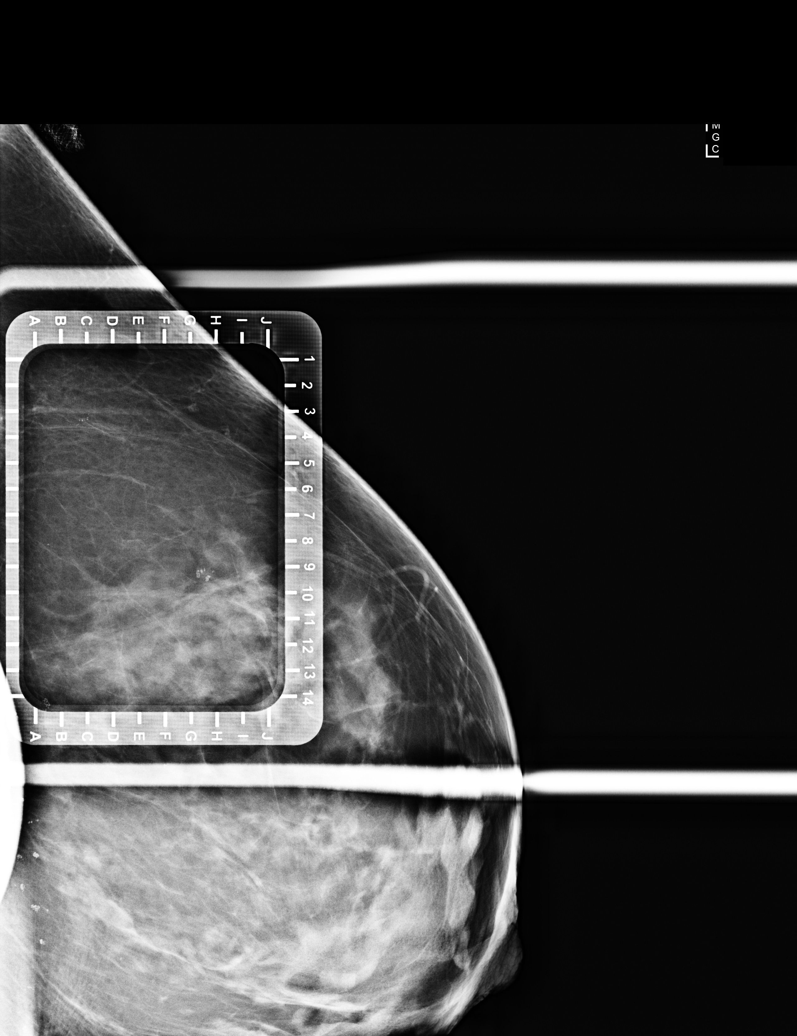

[L TAN]
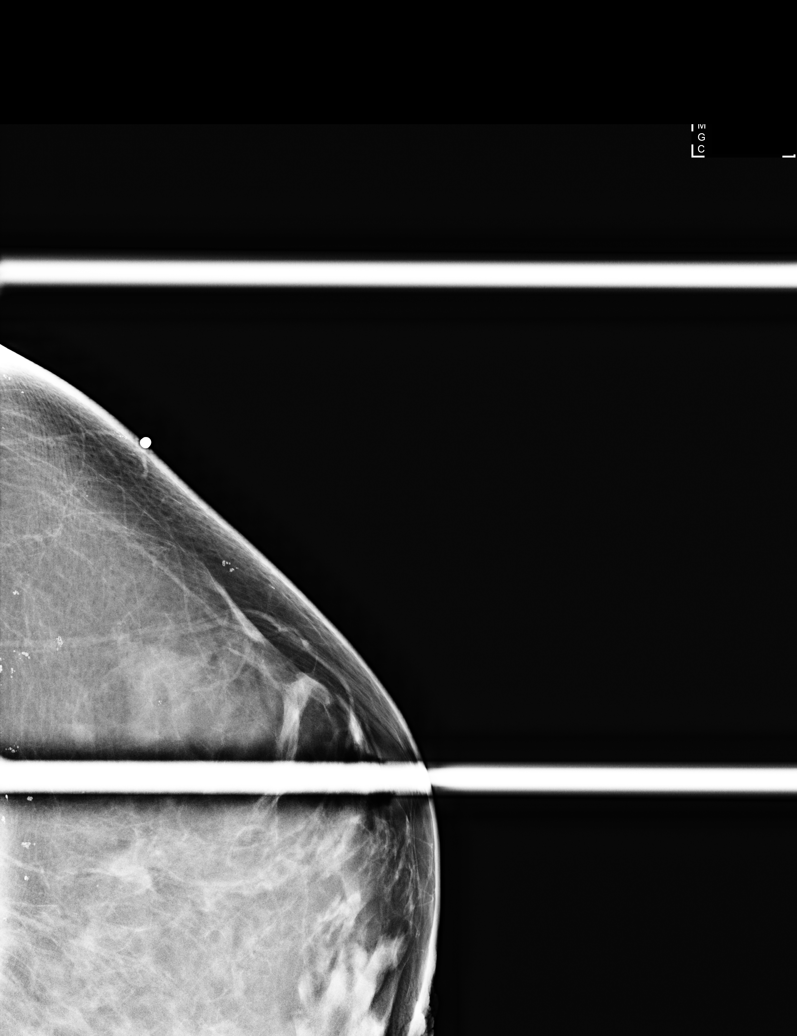

[L ML (4 of 4)]
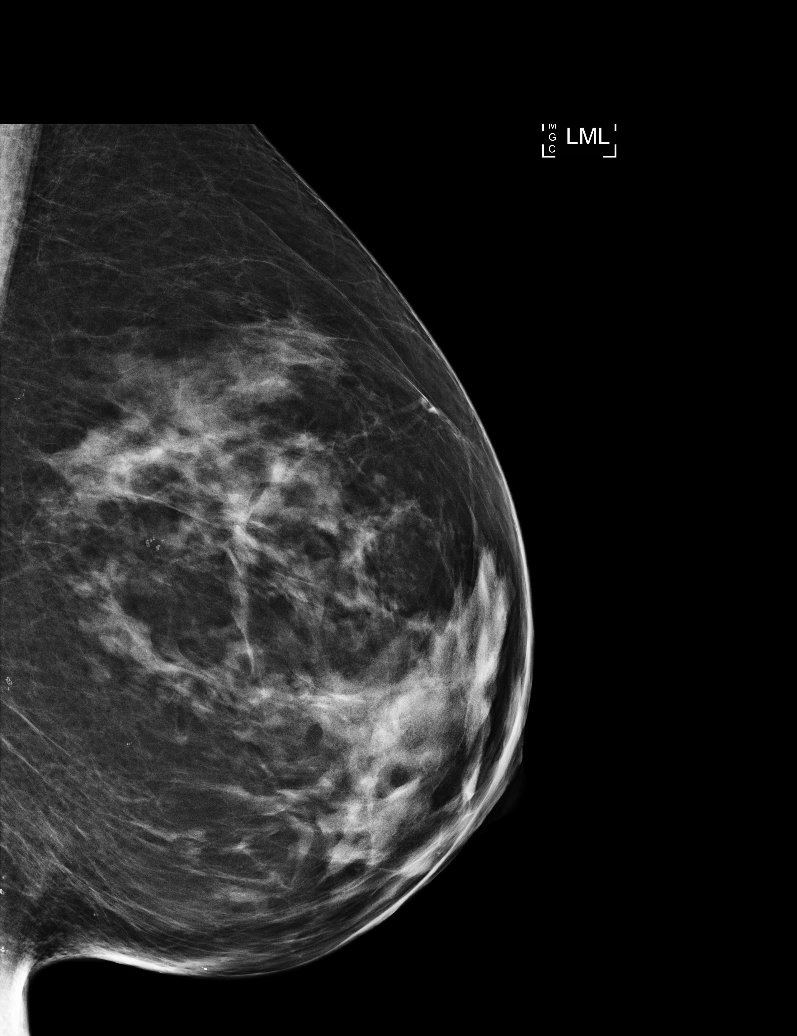

[6 of 6 positions shown; findings below may reference images not displayed]

ACR Breast Density Category b: There are scattered areas of
fibroglandular density.
FINDINGS: On tangential imaging, the small group of calcifications in the left
breast project within the skin, benign. There are multiple
additional lucent centered skin/dermal calcifications in the left
breast that are stable.
IMPRESSION: 1. No evidence of breast malignancy.
2. Benign skin calcifications.

RECOMMENDATION:
Screening mammogram in one year.(Code:ZY-7-0DC)

I have discussed the findings and recommendations with the patient.
If applicable, a reminder letter will be sent to the patient
regarding the next appointment.

BI-RADS CATEGORY  2: Benign.

## 2023-10-23 ENCOUNTER — Ambulatory Visit

## 2023-10-24 ENCOUNTER — Ambulatory Visit

## 2023-10-29 ENCOUNTER — Ambulatory Visit (INDEPENDENT_AMBULATORY_CARE_PROVIDER_SITE_OTHER)

## 2023-10-29 DIAGNOSIS — Z3042 Encounter for surveillance of injectable contraceptive: Secondary | ICD-10-CM | POA: Diagnosis not present

## 2023-10-29 NOTE — Progress Notes (Signed)
 Patient presents for Depo-Provera  injection per orders of Roselie Mood, NP. Injection placed in right deltoid region by Laymon Gladis Sharps, CMA. Patient tolerated procedure well with no concerns.  Patient will make appointment for 3 month (Dec. 3rd- Dec. 17th). Appointment scheduled.

## 2023-12-08 ENCOUNTER — Encounter: Payer: Self-pay | Admitting: Gastroenterology

## 2023-12-30 ENCOUNTER — Encounter (HOSPITAL_COMMUNITY): Payer: Self-pay

## 2023-12-30 ENCOUNTER — Emergency Department (HOSPITAL_COMMUNITY)
Admission: EM | Admit: 2023-12-30 | Discharge: 2023-12-30 | Disposition: A | Discharge status: Home / Self Care | Attending: Emergency Medicine | Admitting: Emergency Medicine

## 2023-12-30 ENCOUNTER — Other Ambulatory Visit: Payer: Self-pay

## 2023-12-30 DIAGNOSIS — F17200 Nicotine dependence, unspecified, uncomplicated: Secondary | ICD-10-CM | POA: Insufficient documentation

## 2023-12-30 DIAGNOSIS — K0889 Other specified disorders of teeth and supporting structures: Secondary | ICD-10-CM | POA: Insufficient documentation

## 2023-12-30 MED ORDER — KETOROLAC TROMETHAMINE 15 MG/ML IJ SOLN
15.0000 mg | Freq: Once | INTRAMUSCULAR | Status: AC
  Administered 2023-12-30: 15 mg via INTRAMUSCULAR
  Filled 2023-12-30: qty 1

## 2023-12-30 NOTE — ED Triage Notes (Signed)
 PT arrived from home via [facility] c/o dental pain in the wisdom tooth area, 9/10 pain

## 2023-12-30 NOTE — Discharge Instructions (Signed)
 Please schedule an appointment with dentistry for further evaluation of your dental pain. Return to the emergency department if you develop any life threatening symptoms.

## 2023-12-30 NOTE — ED Provider Notes (Signed)
 [place] EMERGENCY DEPARTMENT AT [facility] Provider Note   CSN: [id number] Arrival date & time: 12/30/23  0451     Patient presents with: No chief complaint on file.   Lori Hughes  is a 48 y.o. female.  Patient with past medical history significant for tobacco use, anxiety presents the emergency department complaining of upper right dental pain.  She states that she still has her wisdom teeth and began having tenderness in the upper bilateral wisdom tooth area starting last year.  The pain has waxed and waned.  Currently she is complaining of right upper sided pain.  She denies any fever, nausea, vomiting, systemic symptoms.  She does not currently see dentistry.   HPI     Prior to Admission medications   Not on File    Allergies: Patient has no known allergies.    Review of Systems  Updated Vital Signs BP (!) 152/99 (BP Location: Right Arm)   Pulse 97   Temp 98.1 F (36.7 C)   Resp 18   SpO2 98%   Physical Exam Vitals and nursing note reviewed.  HENT:     Head: Normocephalic and atraumatic.     Mouth/Throat:     Comments: No obvious dental abscess or broken tooth, no obvious dental abnormality in the upper right molar region.  No swelling appreciated Eyes:     Pupils: Pupils are equal, round, and reactive to light.  Pulmonary:     Effort: Pulmonary effort is normal. No respiratory distress.  Musculoskeletal:        General: No signs of injury.     Cervical back: Normal range of motion.  Skin:    General: Skin is dry.  Neurological:     Mental Status: She is alert.  Psychiatric:        Speech: Speech normal.        Behavior: Behavior normal.     (all labs ordered are listed, but only abnormal results are displayed) Labs Reviewed - No data to display  EKG: None  Radiology: No results found.   Procedures   Medications Ordered in the ED  ketorolac  (TORADOL ) 15 MG/ML injection 15 mg (has no administration in time range)                                     Medical Decision Making Risk Prescription drug management.   This patient presents to the ED for concern of dental pain, this involves an extensive number of treatment options, and is a complaint that carries with it a high risk of complications and morbidity.  The differential diagnosis includes dental abscess, broken tooth, wisdom tooth complications, other   Co morbidities / Chronic conditions that complicate the patient evaluation  As noted in HPI   Additional history obtained:  Additional history obtained from EMR    Problem List / ED Course / Critical interventions / Medication management   I ordered medication including toradol    Reevaluation of the patient after these medicines showed that the patient improved I have reviewed the patients home medicines and have made adjustments as needed   Social Determinants of Health:  Patient is a daily smoker   Test / Admission - Considered:  Patient with reassuring physical exam. No obvious sign of infection. No broken tooth noted. Plan to have patient follow up with dentistry as an outpatient for further evaluation. Patient voices understanding with  plan. No indication for antibiotics, further emergent evaluation, or admission at this time.       Final diagnoses:  Pain, dental    ED Discharge Orders     None          Logan Ubaldo KATHEE DEVONNA 12/30/23 [id number]    Palumbo, April, MD 12/30/23 [phone]

## 2023-12-31 ENCOUNTER — Ambulatory Visit

## 2023-12-31 VITALS — Ht 62.5 in | Wt 172.8 lb

## 2023-12-31 DIAGNOSIS — Z1211 Encounter for screening for malignant neoplasm of colon: Secondary | ICD-10-CM

## 2023-12-31 MED ORDER — NA SULFATE-K SULFATE-MG SULF 17.5-3.13-1.6 GM/177ML PO SOLN: 1.0000 | Freq: Once | ORAL | 0 refills | Status: AC

## 2023-12-31 NOTE — Progress Notes (Signed)

## 2024-01-14 ENCOUNTER — Encounter: Admitting: Gastroenterology

## 2024-01-27 ENCOUNTER — Ambulatory Visit

## 2024-01-27 DIAGNOSIS — Z23 Encounter for immunization: Secondary | ICD-10-CM | POA: Diagnosis not present

## 2024-01-27 MED ORDER — MEDROXYPROGESTERONE ACETATE 150 MG/ML IM SUSP
150.0000 mg | INTRAMUSCULAR | Status: AC
  Administered 2024-01-27: 15:00:00 150 mg via INTRAMUSCULAR

## 2024-01-27 NOTE — Progress Notes (Signed)
 Patient is in office today for a nurse visit for Birth Control Injection. Patient Injection was given in the  Right deltoid. Patient tolerated injection well.

## 2024-02-17 ENCOUNTER — Encounter: Payer: Self-pay | Admitting: Gastroenterology

## 2024-02-23 ENCOUNTER — Encounter: Admitting: Gastroenterology

## 2024-02-23 ENCOUNTER — Telehealth: Payer: Self-pay | Admitting: Gastroenterology

## 2024-02-23 NOTE — Telephone Encounter (Signed)
 Understood, thank you.  We will wait to hear from her when she is ready to reschedule.  - H. Danis

## 2024-02-23 NOTE — Telephone Encounter (Signed)
 Good afternoon Dr. Legrand,   I received a call from this patient stating that she call this morning at 7 am and spoke to someone in regards to cancelling her procedure for today due to her having the FLU. Patient will call back to reschedule once she is feeling better. Please advise.    Thank you

## 2024-03-30 ENCOUNTER — Ambulatory Visit

## 2024-04-29 ENCOUNTER — Encounter: Admitting: Nurse Practitioner
